# Patient Record
Sex: Male | Born: 1995
Health system: Southern US, Community
[De-identification: ages and names within clinical notes are randomized; demographics above are authoritative.]

## PROBLEM LIST (undated history)

## (undated) DIAGNOSIS — G40909 Epilepsy, unspecified, not intractable, without status epilepticus: Secondary | ICD-10-CM

---
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
## 2019-01-22 ENCOUNTER — Encounter: Payer: Self-pay | Admitting: Family Medicine

## 2019-01-22 ENCOUNTER — Ambulatory Visit (INDEPENDENT_AMBULATORY_CARE_PROVIDER_SITE_OTHER): Payer: BLUE CROSS/BLUE SHIELD | Admitting: Family Medicine

## 2019-01-22 VITALS — BP 184/142 | HR 99 | Ht 71.75 in | Wt 294.0 lb

## 2019-01-22 DIAGNOSIS — R030 Elevated blood-pressure reading, without diagnosis of hypertension: Secondary | ICD-10-CM | POA: Diagnosis not present

## 2019-01-22 DIAGNOSIS — I10 Essential (primary) hypertension: Secondary | ICD-10-CM

## 2019-01-22 MED ORDER — OLMESARTAN MEDOXOMIL-HCTZ 20-12.5 MG PO TABS: 1.0000 | ORAL_TABLET | Freq: Every day | ORAL | 0 refills | Status: DC

## 2019-01-22 NOTE — Patient Instructions (Signed)

## 2019-01-22 NOTE — Progress Notes (Signed)
Christian Wagner - 23 y.o. male MRN [id number]  Date of birth: [birth year]   This visit type was conducted due to national recommendations for restrictions regarding the COVID-19 Pandemic (e.g. social distancing).  This format is felt to be most appropriate for this patient at this time.  All issues noted in this document were discussed and addressed.  No physical exam was performed (except for noted visual exam findings with Video Visits).  I discussed the limitations of evaluation and management by telemedicine and the availability of in person appointments. The patient expressed understanding and agreed to proceed.  I connected with@ on 01/22/19 at  1:30 PM EDT by a video enabled telemedicine application and verified that I am speaking with the correct person using two identifiers.   Patient Location[place] [address] [place] [id number]   Provider location:   Home office  Chief Complaint  Patient presents with  . Hypertension    NP , BP reading 180/123 , FAx HTN - MGM    HPI  Christian Wagner is a 23 y.o. male who presents via audio/video conferencing for a telehealth visit today.  This is [name]'s initial visit with me and he has concern about elevated blood pressure.  Reports he has had several readings at home with BP around 180/100-120.  He denies any symptoms related to hypertension including headache, vision changes, chest pain, shortness of breath, or dizziness.  He does admit to eating a diet in highly processed and fast foods.  He has a fairly physical job.  Mother reports snoring but patient denies any feeling of fatigue or difficulty sleeping.    ROS:  A comprehensive ROS was completed and negative except as noted per HPI  Past Medical History:  Diagnosis Date  . Epilepsy (HCC)     History reviewed. No pertinent surgical history.  Family History  Problem Relation Age of Onset  . Hypertension Maternal Uncle   . Hypertension Maternal Grandmother      Social History   Socioeconomic History  . Marital status: Single    Spouse name: Not on file  . Number of children: Not on file  . Years of education: Not on file  . Highest education level: Not on file  Occupational History  . Not on file  Social Needs  . Financial resource strain: Not on file  . Food insecurity:    Worry: Not on file    Inability: Not on file  . Transportation needs:    Medical: Not on file    Non-medical: Not on file  Tobacco Use  . Smoking status: Former Games developermoker  . Smokeless tobacco: Never Used  Substance and Sexual Activity  . Alcohol use: Yes    Comment: soically   . Drug use: No  . Sexual activity: Not on file  Lifestyle  . Physical activity:    Days per week: Not on file    Minutes per session: Not on file  . Stress: Not on file  Relationships  . Social connections:    Talks on phone: Not on file    Gets together: Not on file    Attends religious service: Not on file    Active member of club or organization: Not on file    Attends meetings of clubs or organizations: Not on file    Relationship status: Not on file  . Intimate partner violence:    Fear of current or ex partner: Not on file    Emotionally abused: Not  on file    Physically abused: Not on file    Forced sexual activity: Not on file  Other Topics Concern  . Not on file  Social History Narrative  . Not on file    No current outpatient medications on file.  EXAM:  VITALS per patient if applicable: BP (!) 184/142   Pulse (!) 110   Ht 5' 11.75" (1.822 m)   Wt 294 lb (133.4 kg) Comment: pt reports from hm  BMI 40.15 kg/m   GENERAL: alert, oriented, appears well and in no acute distress  HEENT: atraumatic, conjunttiva clear, no obvious abnormalities on inspection of external nose and ears  NECK: normal movements of the head and neck  LUNGS: on inspection no signs of respiratory distress, breathing rate appears normal, no obvious gross SOB, gasping or wheezing  CV: no  obvious cyanosis  MS: moves all visible extremities without noticeable abnormality  PSYCH/NEURO: pleasant and cooperative, no obvious depression or anxiety, speech and thought processing grossly intact  ASSESSMENT AND PLAN:  Discussed the following assessment and plan:  Essential hypertension -BP elevated at home.  I had him stop by the clinic this afternoon as well for a manual BP reading which was 194/134 with repeat 184/142. -Labs completed today.  -Will likely need combo therapy to get to goal, will start benicar-hct -Limit high salt foods/fast foods -He will continue to monitor BP at home and follow up with me in 1 month.  -Discussed red flags that would prompt him to see emergency care regarding his BP.         I discussed the assessment and treatment plan with the patient. The patient was provided an opportunity to ask questions and all were answered. The patient agreed with the plan and demonstrated an understanding of the instructions.   The patient was advised to call back or seek an in-person evaluation if the symptoms worsen or if the condition fails to improve as anticipated.   Everrett Coombe, DO

## 2019-01-22 NOTE — Assessment & Plan Note (Addendum)
-  BP elevated at home.  I had him stop by the clinic this afternoon as well for a manual BP reading which was 194/134 with repeat 184/142. -Labs completed today.  -Will likely need combo therapy to get to goal, will start benicar-hct -Limit high salt foods/fast foods -He will continue to monitor BP at home and follow up with me in 1 month.  -Discussed red flags that would prompt him to see emergency care regarding his BP.

## 2019-01-25 ENCOUNTER — Other Ambulatory Visit: Payer: Self-pay | Admitting: Family Medicine

## 2019-01-25 DIAGNOSIS — R7309 Other abnormal glucose: Secondary | ICD-10-CM

## 2019-01-25 LAB — COMPREHENSIVE METABOLIC PANEL
AG Ratio: 1.7 (calc) (ref 1.0–2.5)
ALT: 71 U/L — ABNORMAL HIGH (ref 9–46)
AST: 38 U/L (ref 10–40)
Albumin: 4.6 g/dL (ref 3.6–5.1)
Alkaline phosphatase (APISO): 82 U/L (ref 36–130)
BUN: 18 mg/dL (ref 7–25)
CO2: 26 mmol/L (ref 20–32)
Calcium: 9.4 mg/dL (ref 8.6–10.3)
Chloride: 103 mmol/L (ref 98–110)
Creat: 1.08 mg/dL (ref 0.60–1.35)
Globulin: 2.7 g/dL (calc) (ref 1.9–3.7)
Glucose, Bld: 128 mg/dL — ABNORMAL HIGH (ref 65–99)
Potassium: 4.4 mmol/L (ref 3.5–5.3)
Sodium: 137 mmol/L (ref 135–146)
Total Bilirubin: 0.6 mg/dL (ref 0.2–1.2)
Total Protein: 7.3 g/dL (ref 6.1–8.1)

## 2019-01-25 LAB — TEST AUTHORIZATION

## 2019-01-25 LAB — CBC
HCT: 43.2 % (ref 38.5–50.0)
Hemoglobin: 14.9 g/dL (ref 13.2–17.1)
MCH: 30.6 pg (ref 27.0–33.0)
MCHC: 34.5 g/dL (ref 32.0–36.0)
MCV: 88.7 fL (ref 80.0–100.0)
MPV: 12.3 fL (ref 7.5–12.5)
Platelets: 201 10*3/uL (ref 140–400)
RBC: 4.87 10*6/uL (ref 4.20–5.80)
RDW: 12.9 % (ref 11.0–15.0)
WBC: 6.7 10*3/uL (ref 3.8–10.8)

## 2019-01-25 LAB — HEMOGLOBIN A1C W/OUT EAG: Hgb A1c MFr Bld: 5.5 % of total Hgb (ref <5.7)

## 2019-01-25 LAB — TSH: TSH: 1.41 mIU/L (ref 0.40–4.50)

## 2019-01-25 NOTE — Progress Notes (Signed)
Test added.   

## 2019-01-25 NOTE — Progress Notes (Signed)
-  Glucose elevated but A1c is normal, meaning he does not have diabetes. -Liver enzymes are a little elevated which could be coming from fatty liver, high blood pressure and/or EtOH use.   -Recommend low salt, low fat diet and limited EtOH consumption (2 or less beers/Shots/mixed drinks per day)

## 2019-01-25 NOTE — Progress Notes (Signed)
Please ask lab to add a1c for elevated glucose. Orders entered.  Thanks.

## 2019-02-26 ENCOUNTER — Telehealth: Payer: BLUE CROSS/BLUE SHIELD | Admitting: Family Medicine

## 2019-02-26 ENCOUNTER — Encounter: Payer: Self-pay | Admitting: Family Medicine

## 2019-02-26 ENCOUNTER — Telehealth (INDEPENDENT_AMBULATORY_CARE_PROVIDER_SITE_OTHER): Payer: BLUE CROSS/BLUE SHIELD | Admitting: Family Medicine

## 2019-02-26 DIAGNOSIS — I10 Essential (primary) hypertension: Secondary | ICD-10-CM | POA: Diagnosis not present

## 2019-02-26 MED ORDER — OLMESARTAN MEDOXOMIL-HCTZ 40-25 MG PO TABS: 1.0000 | ORAL_TABLET | Freq: Every day | ORAL | 1 refills | Status: DC

## 2019-02-26 NOTE — Progress Notes (Signed)
Christian Wagner - 23 y.o. male MRN [id number]  Date of birth: 1996/04/13   This visit type was conducted due to national recommendations for restrictions regarding the COVID-19 Pandemic (e.g. social distancing).  This format is felt to be most appropriate for this patient at this time.  All issues noted in this document were discussed and addressed.  No physical exam was performed (except for noted visual exam findings with Video Visits).  I discussed the limitations of evaluation and management by telemedicine and the availability of in person appointments. The patient expressed understanding and agreed to proceed.  I connected with@ on 02/26/19 at 10:00 AM EDT by a video enabled telemedicine application and verified that I am speaking with the correct person using two identifiers.   Patient Location: Home [address] [place] [address]   Provider location:   Home office  Chief Complaint  Patient presents with  . Follow-up    4 wk F/U HTN / Meds check 150's-160's/100's-115    HPI  Christian Wagner is a 23 y.o. male who presents via audio/video conferencing for a telehealth visit today.  He is following up today for HTN.  Started on benicar-HCT at previous visit.  BP readings at home are now around 150's/90-100's.  LFT's elevated on recent lab work.  He admits to 4-5 beers every day.  Denies underlying anxiety or sleep problems.  Thinks he can make changes to EtOH consumption and dietary changes as well.  Denies chest pain, shortness of breath, palpitations, headache or vision changes.    ROS:  A comprehensive ROS was completed and negative except as noted per HPI  Past Medical History:  Diagnosis Date  . Epilepsy (HCC)     No past surgical history on file.  Family History  Problem Relation Age of Onset  . Hypertension Maternal Uncle   . Hypertension Maternal Grandmother     Social History   Socioeconomic History  . Marital status: Single    Spouse name: Not  on file  . Number of children: Not on file  . Years of education: Not on file  . Highest education level: Not on file  Occupational History  . Not on file  Social Needs  . Financial resource strain: Not on file  . Food insecurity:    Worry: Not on file    Inability: Not on file  . Transportation needs:    Medical: Not on file    Non-medical: Not on file  Tobacco Use  . Smoking status: Former Games developer  . Smokeless tobacco: Never Used  Substance and Sexual Activity  . Alcohol use: Yes    Comment: soically   . Drug use: No  . Sexual activity: Not on file  Lifestyle  . Physical activity:    Days per week: Not on file    Minutes per session: Not on file  . Stress: Not on file  Relationships  . Social connections:    Talks on phone: Not on file    Gets together: Not on file    Attends religious service: Not on file    Active member of club or organization: Not on file    Attends meetings of clubs or organizations: Not on file    Relationship status: Not on file  . Intimate partner violence:    Fear of current or ex partner: Not on file    Emotionally abused: Not on file    Physically abused: Not on file    Forced  sexual activity: Not on file  Other Topics Concern  . Not on file  Social History Narrative  . Not on file     Current Outpatient Medications:  .  olmesartan-hydrochlorothiazide (BENICAR HCT) 40-25 MG tablet, Take 1 tablet by mouth daily., Disp: 90 tablet, Rfl: 1  EXAM:  VITALS per patient if applicable: BP (!) 159/106   Ht 5' 11.75" (1.822 m)   Wt 294 lb (133.4 kg)   BMI 40.15 kg/m   GENERAL: alert, oriented, appears well and in no acute distress  HEENT: atraumatic, conjunttiva clear, no obvious abnormalities on inspection of external nose and ears  NECK: normal movements of the head and neck  LUNGS: on inspection no signs of respiratory distress, breathing rate appears normal, no obvious gross SOB, gasping or wheezing  CV: no obvious cyanosis   MS: moves all visible extremities without noticeable abnormality  PSYCH/NEURO: pleasant and cooperative, no obvious depression or anxiety, speech and thought processing grossly intact  ASSESSMENT AND PLAN:  Discussed the following assessment and plan:  Essential hypertension -BP remains uncontrolled -Continue to titrate benicar-hct to 40/25mg .  -Discussed reduction in EtOH intake and following a low salt diet.  -F/u in 6 weeks       I discussed the assessment and treatment plan with the patient. The patient was provided an opportunity to ask questions and all were answered. The patient agreed with the plan and demonstrated an understanding of the instructions.   The patient was advised to call back or seek an in-person evaluation if the symptoms worsen or if the condition fails to improve as anticipated.    Everrett Coombeody Amariz Flamenco, DO

## 2019-02-26 NOTE — Assessment & Plan Note (Signed)
-  BP remains uncontrolled -Continue to titrate benicar-hct to 40/25mg .  -Discussed reduction in EtOH intake and following a low salt diet.  -F/u in 6 weeks

## 2019-04-09 ENCOUNTER — Telehealth: Payer: Self-pay

## 2019-04-09 NOTE — Telephone Encounter (Signed)
Questions for Screening COVID-19  Symptom onset: none, Pt was given & repeated #2046  Travel or Contacts:none  During this illness, did/does the patient experience any of the following symptoms? Fever >100.6F []   Yes [x]   No []   Unknown Subjective fever (felt feverish) []   Yes [x]   No []   Unknown Chills []   Yes [x]   No []   Unknown Muscle aches (myalgia) []   Yes [x]   No []   Unknown Runny nose (rhinorrhea) []   Yes [x]   No []   Unknown Sore throat []   Yes [x]   No []   Unknown Cough (new onset or worsening of chronic cough) []   Yes [x]   No []   Unknown Shortness of breath (dyspnea) []   Yes [x]   No []   Unknown Nausea or vomiting []   Yes []   No [x]   Unknown Headache []   Yes []   No [x]   Unknown Abdominal pain  []   Yes [x]   No []   Unknown Diarrhea (?3 loose/looser than normal stools/24hr period) []   Yes [x]   No []   Unknown Other, specify:  Patient risk factors: Smoker? []   Current [x]   Former []   Never If male, currently pregnant? []   Yes [x]   No  Patient Active Problem List   Diagnosis Date Noted  . Essential hypertension 01/22/2019    Plan:  []   High risk for COVID-19 with red flags go to ED (with CP, SOB, weak/lightheaded, or fever > 101.5). Call ahead.  []   High risk for COVID-19 but stable. Inform provider and coordinate time for [facility] visit.   [x]   No red flags but URI signs or symptoms okay for [facility] visit.

## 2019-04-12 ENCOUNTER — Ambulatory Visit (INDEPENDENT_AMBULATORY_CARE_PROVIDER_SITE_OTHER): Payer: BC Managed Care – PPO | Admitting: Family Medicine

## 2019-04-12 ENCOUNTER — Encounter: Payer: Self-pay | Admitting: Family Medicine

## 2019-04-12 VITALS — BP 130/88 | HR 93 | Temp 98.4°F | Resp 18 | Ht 71.75 in | Wt 296.0 lb

## 2019-04-12 DIAGNOSIS — I10 Essential (primary) hypertension: Secondary | ICD-10-CM | POA: Diagnosis not present

## 2019-04-12 DIAGNOSIS — F1010 Alcohol abuse, uncomplicated: Secondary | ICD-10-CM | POA: Diagnosis not present

## 2019-04-12 NOTE — Progress Notes (Signed)
[name] - 23 y.o. male MRN [id number]  Date of birth: 01-26-1996  Subjective Chief Complaint  Patient presents with  . Follow-up    6 wk F/U HTN     HPI Christian Wagner is a 23 y.o. male here today for follow up of HTN.  Current treatment with benicar-hct 40/25mg .  He is tolerating well.  BP at home ranging from 130-140/85-94.  He does admit today that he has been drinking a good amount of EtOH.  He had been consuming a 40 oz beer and a four loko every night.  He has cut out the four loko and is working on further reducing his alcohol consumption.  He also recently started exercising more.    He denies chest pain, shortness of breath, palpitations, headache or vision changes.  Denies significant anxiety at this time.   Depression screen [facility] 2/9 04/12/2019  Decreased Interest 0  Down, Depressed, Hopeless 0  PHQ - 2 Score 0  Altered sleeping 1  Tired, decreased energy 1  Change in appetite 0  Feeling bad or failure about yourself  1  Trouble concentrating 0  Moving slowly or fidgety/restless 0  Suicidal thoughts 0  PHQ-9 Score 3  Difficult doing work/chores Somewhat difficult   GAD 7 : Generalized Anxiety Score 04/12/2019  Nervous, Anxious, on Edge 2  Control/stop worrying 1  Worry too much - different things 1  Trouble relaxing 1  Restless 0  Easily annoyed or irritable 0  Afraid - awful might happen 1  Total GAD 7 Score 6  Anxiety Difficulty Somewhat difficult      ROS:  A comprehensive ROS was completed and negative except as noted per HPI  Allergies  Allergen Reactions  . Penicillins     Past Medical History:  Diagnosis Date  . Epilepsy ([place])     No past surgical history on file.  Social History   Socioeconomic History  . Marital status: Single    Spouse name: Not on file  . Number of children: Not on file  . Years of education: Not on file  . Highest education level: Not on file  Occupational History  . Not on file  Social Needs  .  Financial resource strain: Not on file  . Food insecurity    Worry: Not on file    Inability: Not on file  . Transportation needs    Medical: Not on file    Non-medical: Not on file  Tobacco Use  . Smoking status: Former Research scientist (life sciences)  . Smokeless tobacco: Never Used  Substance and Sexual Activity  . Alcohol use: Yes    Comment: soically   . Drug use: No  . Sexual activity: Not on file  Lifestyle  . Physical activity    Days per week: Not on file    Minutes per session: Not on file  . Stress: Not on file  Relationships  . Social Herbalist on phone: Not on file    Gets together: Not on file    Attends religious service: Not on file    Active member of club or organization: Not on file    Attends meetings of clubs or organizations: Not on file    Relationship status: Not on file  Other Topics Concern  . Not on file  Social History Narrative  . Not on file    Family History  Problem Relation Age of Onset  . Hypertension Maternal Uncle   . Hypertension Maternal  Grandmother     Health Maintenance  Topic Date Due  . HIV Screening  10/20/2008  . INFLUENZA VACCINE  04/24/2019  . TETANUS/TDAP  03/20/2022    ----------------------------------------------------------------------------------------------------------------------------------------------------------------------------------------------------------------- Physical Exam BP 130/88   Pulse 93   Temp 98.4 F (36.9 C) (Oral)   Resp 18   Ht 5' 11.75" (1.822 m)   Wt 296 lb (134.3 kg)   SpO2 97%   BMI 40.43 kg/m   Physical Exam Constitutional:      Appearance: He is obese.  HENT:     Head: Normocephalic and atraumatic.     Mouth/Throat:     Mouth: Mucous membranes are moist.  Eyes:     General: No scleral icterus. Cardiovascular:     Rate and Rhythm: Normal rate and regular rhythm.  Pulmonary:     Effort: Pulmonary effort is normal.     Breath sounds: Normal breath sounds.  Skin:    General: Skin  is warm and dry.  Neurological:     General: No focal deficit present.     Mental Status: He is alert.  Psychiatric:        Mood and Affect: Mood normal.        Behavior: Behavior normal.     ------------------------------------------------------------------------------------------------------------------------------------------------------------------------------------------------------------------- Assessment and Plan  Essential hypertension -BP elevated initially, rechecked towards end of his appt with improvement to 130/88.  Recommend continuation of current medication.  -Continue to reduce EtOH consumption and follow low salt diet.  -Recommend weight loss with initial goal of 20-25lbs over the next 6 months.    >25 minutes spent with patient with >50% of time spent counseling regarding HTN and alcohol use.

## 2019-04-12 NOTE — Assessment & Plan Note (Signed)
-  BP elevated initially, rechecked towards end of his appt with improvement to 130/88.  Recommend continuation of current medication.  -Continue to reduce EtOH consumption and follow low salt diet.  -Recommend weight loss with initial goal of 20-25lbs over the next 6 months.

## 2019-04-12 NOTE — Patient Instructions (Signed)
Continue to work on cutting back on alcohol use, with goal of quitting Follow a low salt diet with regular exercise.  Your goal for the next time I see you is a 20-25lb weight loss.    Hypertension, Adult Hypertension is another name for high blood pressure. High blood pressure forces your heart to work harder to pump blood. This can cause problems over time. There are two numbers in a blood pressure reading. There is a top number (systolic) over a bottom number (diastolic). It is best to have a blood pressure that is below 120/80. Healthy choices can help lower your blood pressure, or you may need medicine to help lower it. What are the causes? The cause of this condition is not known. Some conditions may be related to high blood pressure. What increases the risk?  Smoking.  Having type 2 diabetes mellitus, high cholesterol, or both.  Not getting enough exercise or physical activity.  Being overweight.  Having too much fat, sugar, calories, or salt (sodium) in your diet.  Drinking too much alcohol.  Having long-term (chronic) kidney disease.  Having a family history of high blood pressure.  Age. Risk increases with age.  Race. You may be at higher risk if you are African American.  Gender. Men are at higher risk than women before age 56. After age 10, women are at higher risk than men.  Having obstructive sleep apnea.  Stress. What are the signs or symptoms?  High blood pressure may not cause symptoms. Very high blood pressure (hypertensive crisis) may cause: ? Headache. ? Feelings of worry or nervousness (anxiety). ? Shortness of breath. ? Nosebleed. ? A feeling of being sick to your stomach (nausea). ? Throwing up (vomiting). ? Changes in how you see. ? Very bad chest pain. ? Seizures. How is this treated?  This condition is treated by making healthy lifestyle changes, such as: ? Eating healthy foods. ? Exercising more. ? Drinking less alcohol.  Your health  care provider may prescribe medicine if lifestyle changes are not enough to get your blood pressure under control, and if: ? Your top number is above 130. ? Your bottom number is above 80.  Your personal target blood pressure may vary. Follow these instructions at home: Eating and drinking   If told, follow the DASH eating plan. To follow this plan: ? Fill one half of your plate at each meal with fruits and vegetables. ? Fill one fourth of your plate at each meal with whole grains. Whole grains include whole-wheat pasta, brown rice, and whole-grain bread. ? Eat or drink low-fat dairy products, such as skim milk or low-fat yogurt. ? Fill one fourth of your plate at each meal with low-fat (lean) proteins. Low-fat proteins include fish, chicken without skin, eggs, beans, and tofu. ? Avoid fatty meat, cured and processed meat, or chicken with skin. ? Avoid pre-made or processed food.  Eat less than 1,500 mg of salt each day.  Do not drink alcohol if: ? Your doctor tells you not to drink. ? You are pregnant, may be pregnant, or are planning to become pregnant.  If you drink alcohol: ? Limit how much you use to:  0-1 drink a day for women.  0-2 drinks a day for men. ? Be aware of how much alcohol is in your drink. In the [place] one drink equals one 12 oz bottle of beer (355 mL), one 5 oz glass of wine (148 mL), or one 1 oz glass of hard  liquor (44 mL). Lifestyle   Work with your doctor to stay at a healthy weight or to lose weight. Ask your doctor what the best weight is for you.  Get at least 30 minutes of exercise most days of the week. This may include walking, swimming, or biking.  Get at least 30 minutes of exercise that strengthens your muscles (resistance exercise) at least 3 days a week. This may include lifting weights or doing Pilates.  Do not use any products that contain nicotine or tobacco, such as cigarettes, e-cigarettes, and chewing tobacco. If you need help quitting,  ask your doctor.  Check your blood pressure at home as told by your doctor.  Keep all follow-up visits as told by your doctor. This is important. Medicines  Take over-the-counter and prescription medicines only as told by your doctor. Follow directions carefully.  Do not skip doses of blood pressure medicine. The medicine does not work as well if you skip doses. Skipping doses also puts you at risk for problems.  Ask your doctor about side effects or reactions to medicines that you should watch for. Contact a doctor if you:  Think you are having a reaction to the medicine you are taking.  Have headaches that keep coming back (recurring).  Feel dizzy.  Have swelling in your ankles.  Have trouble with your vision. Get help right away if you:  Get a very bad headache.  Start to feel mixed up (confused).  Feel weak or numb.  Feel faint.  Have very bad pain in your: ? Chest. ? Belly (abdomen).  Throw up more than once.  Have trouble breathing. Summary  Hypertension is another name for high blood pressure.  High blood pressure forces your heart to work harder to pump blood.  For most people, a normal blood pressure is less than 120/80.  Making healthy choices can help lower blood pressure. If your blood pressure does not get lower with healthy choices, you may need to take medicine. This information is not intended to replace advice given to you by your health care provider. Make sure you discuss any questions you have with your health care provider. Document Released: 02/26/2008 Document Revised: 05/20/2018 Document Reviewed: 05/20/2018 Elsevier Patient Education  2020 [employer]

## 2019-04-14 ENCOUNTER — Other Ambulatory Visit: Payer: Self-pay | Admitting: Family Medicine

## 2019-04-14 NOTE — Telephone Encounter (Signed)
PT was seen this week. He's on Olmesartan-HCTZ 20-12.5 mg, but Pt states that his pharmacy gave him Lisinopril . LOV note stated to titrate up to 40-25mg . Please advise. Thanks

## 2019-04-19 NOTE — Telephone Encounter (Signed)
Rx changed to benicar-hctz 40/25mg  at last appt.

## 2019-04-19 NOTE — Telephone Encounter (Signed)
Called [facility] # [id number], Spoke to Rule, She stated "PT picked up Benicar-HCTZ 40mg -25mg  on 04/21/2019". Task completed.

## 2019-09-12 ENCOUNTER — Other Ambulatory Visit: Payer: Self-pay | Admitting: Family Medicine

## 2019-09-13 NOTE — Telephone Encounter (Signed)
Please refill x30 days and schedule f/u appt.

## 2019-09-16 NOTE — Telephone Encounter (Signed)
Would it be okay for a virtual visit or office only?

## 2019-10-13 ENCOUNTER — Encounter: Payer: Self-pay | Admitting: Family Medicine

## 2019-10-13 ENCOUNTER — Ambulatory Visit (INDEPENDENT_AMBULATORY_CARE_PROVIDER_SITE_OTHER): Payer: BC Managed Care – PPO | Admitting: Family Medicine

## 2019-10-13 ENCOUNTER — Other Ambulatory Visit: Payer: Self-pay

## 2019-10-13 VITALS — BP 132/84 | HR 80 | Temp 98.2°F | Ht 71.75 in | Wt 295.4 lb

## 2019-10-13 DIAGNOSIS — I10 Essential (primary) hypertension: Secondary | ICD-10-CM

## 2019-10-13 DIAGNOSIS — F411 Generalized anxiety disorder: Secondary | ICD-10-CM | POA: Diagnosis not present

## 2019-10-13 DIAGNOSIS — Z23 Encounter for immunization: Secondary | ICD-10-CM

## 2019-10-13 DIAGNOSIS — F1010 Alcohol abuse, uncomplicated: Secondary | ICD-10-CM

## 2019-10-13 LAB — BASIC METABOLIC PANEL
BUN: 24 mg/dL — ABNORMAL HIGH (ref 6–23)
CO2: 26 mEq/L (ref 19–32)
Calcium: 9.5 mg/dL (ref 8.4–10.5)
Chloride: 100 mEq/L (ref 96–112)
Creatinine, Ser: 0.92 mg/dL (ref 0.40–1.50)
GFR: 99.46 mL/min (ref >60.00)
Glucose, Bld: 99 mg/dL (ref 70–99)
Potassium: 3.8 mEq/L (ref 3.5–5.1)
Sodium: 135 mEq/L (ref 135–145)

## 2019-10-13 NOTE — Progress Notes (Signed)
Christian Wagner - 24 y.o. male MRN [id number]  Date of birth: [birth year]  Subjective Chief Complaint  Patient presents with  . Hypertension    6 month follow up.  Pt has questions about the Flu shot.    HPI Christian Wagner is a 24 y.o. male with history of HTN and EtOH dependence here today for follow up of HTN.  He is currently treated with benicar-hct.  He reports that he is doing well with current medication.  He denies side effects.  He does not check BP at home. He does not follow a low salt diet or exercise regularly.  He denies chest pain, shortness of breath, palpitations, edema or headache.    He reports that he had increased his EtOH intake over the holidays due to stress.  He has cut back again now.  He declines medication or referral for counseling to help with his anxiety.    ROS:  A comprehensive ROS was completed and negative except as noted per HPI  Allergies  Allergen Reactions  . Penicillins     Past Medical History:  Diagnosis Date  . Epilepsy ([place])     History reviewed. No pertinent surgical history.  Social History   Socioeconomic History  . Marital status: Single    Spouse name: Not on file  . Number of children: Not on file  . Years of education: Not on file  . Highest education level: Not on file  Occupational History  . Not on file  Tobacco Use  . Smoking status: Former Research scientist (life sciences)  . Smokeless tobacco: Never Used  Substance and Sexual Activity  . Alcohol use: Yes    Comment: soically   . Drug use: No  . Sexual activity: Not on file  Other Topics Concern  . Not on file  Social History Narrative  . Not on file   Social Determinants of Health   Financial Resource Strain:   . Difficulty of Paying Living Expenses: Not on file  Food Insecurity:   . Worried About Charity fundraiser in the Last Year: Not on file  . Ran Out of Food in the Last Year: Not on file  Transportation Needs:   . Lack of Transportation (Medical): Not on file  .  Lack of Transportation (Non-Medical): Not on file  Physical Activity:   . Days of Exercise per Week: Not on file  . Minutes of Exercise per Session: Not on file  Stress:   . Feeling of Stress : Not on file  Social Connections:   . Frequency of Communication with Friends and Family: Not on file  . Frequency of Social Gatherings with Friends and Family: Not on file  . Attends Religious Services: Not on file  . Active Member of Clubs or Organizations: Not on file  . Attends Archivist Meetings: Not on file  . Marital Status: Not on file    Family History  Problem Relation Age of Onset  . Hypertension Maternal Uncle   . Hypertension Maternal Grandmother     Health Maintenance  Topic Date Due  . HIV Screening  10/20/2008  . INFLUENZA VACCINE  04/24/2019  . TETANUS/TDAP  03/20/2022    ----------------------------------------------------------------------------------------------------------------------------------------------------------------------------------------------------------------- Physical Exam BP 132/84   Pulse 80   Temp 98.2 F (36.8 C) (Temporal)   Ht 5' 11.75" (1.822 m)   Wt 295 lb 6.4 oz (134 kg)   SpO2 99%   BMI 40.34 kg/m   Physical Exam Constitutional:  Appearance: Normal appearance.  HENT:     Head: Normocephalic and atraumatic.  Eyes:     General: No scleral icterus. Cardiovascular:     Rate and Rhythm: Normal rate and regular rhythm.  Pulmonary:     Effort: Pulmonary effort is normal.     Breath sounds: Normal breath sounds.  Skin:    General: Skin is warm and dry.  Neurological:     General: No focal deficit present.     Mental Status: He is alert.  Psychiatric:        Mood and Affect: Mood normal.        Behavior: Behavior normal.      ------------------------------------------------------------------------------------------------------------------------------------------------------------------------------------------------------------------- Assessment and Plan  Alcohol use disorder, mild, abuse Recommended that he continue to cut back with goal of d/c EtOH consumption due to his history of overuse/abuse.   GAD (generalized anxiety disorder) Discussed recommendations for medication and/or counseling to help with anxiety and EtOH abuse.  He declines.   Essential hypertension BP is well controlled at this time. Discussed how EtOH use affects BP.  Recommend low salt diet.   Continue current medications. Update BMP.    This visit occurred during the SARS-CoV-2 public health emergency.  Safety protocols were in place, including screening questions prior to the visit, additional usage of staff PPE, and extensive cleaning of exam room while observing appropriate contact time as indicated for disinfecting solutions.

## 2019-10-13 NOTE — Patient Instructions (Addendum)
Health Maintenance Due  Topic Date Due  . HIV Screening  10/20/2008  . INFLUENZA VACCINE  04/24/2019    Hypertension, Adult High blood pressure (hypertension) is when the force of blood pumping through the arteries is too strong. The arteries are the blood vessels that carry blood from the heart throughout the body. Hypertension forces the heart to work harder to pump blood and may cause arteries to become narrow or stiff. Untreated or uncontrolled hypertension can cause a heart attack, heart failure, a stroke, kidney disease, and other problems. A blood pressure reading consists of a higher number over a lower number. Ideally, your blood pressure should be below 120/80. The first ("top") number is called the systolic pressure. It is a measure of the pressure in your arteries as your heart beats. The second ("bottom") number is called the diastolic pressure. It is a measure of the pressure in your arteries as the heart relaxes. What are the causes? The exact cause of this condition is not known. There are some conditions that result in or are related to high blood pressure. What increases the risk? Some risk factors for high blood pressure are under your control. The following factors may make you more likely to develop this condition:  Smoking.  Having type 2 diabetes mellitus, high cholesterol, or both.  Not getting enough exercise or physical activity.  Being overweight.  Having too much fat, sugar, calories, or salt (sodium) in your diet.  Drinking too much alcohol. Some risk factors for high blood pressure may be difficult or impossible to change. Some of these factors include:  Having chronic kidney disease.  Having a family history of high blood pressure.  Age. Risk increases with age.  Race. You may be at higher risk if you are African American.  Gender. Men are at higher risk than women before age 23. After age 22, women are at higher risk than men.  Having obstructive  sleep apnea.  Stress. What are the signs or symptoms? High blood pressure may not cause symptoms. Very high blood pressure (hypertensive crisis) may cause:  Headache.  Anxiety.  Shortness of breath.  Nosebleed.  Nausea and vomiting.  Vision changes.  Severe chest pain.  Seizures. How is this diagnosed? This condition is diagnosed by measuring your blood pressure while you are seated, with your arm resting on a flat surface, your legs uncrossed, and your feet flat on the floor. The cuff of the blood pressure monitor will be placed directly against the skin of your upper arm at the level of your heart. It should be measured at least twice using the same arm. Certain conditions can cause a difference in blood pressure between your right and left arms. Certain factors can cause blood pressure readings to be lower or higher than normal for a short period of time:  When your blood pressure is higher when you are in a health care provider's office than when you are at home, this is called white coat hypertension. Most people with this condition do not need medicines.  When your blood pressure is higher at home than when you are in a health care provider's office, this is called masked hypertension. Most people with this condition may need medicines to control blood pressure. If you have a high blood pressure reading during one visit or you have normal blood pressure with other risk factors, you may be asked to:  Return on a different day to have your blood pressure checked again.  Monitor  your blood pressure at home for 1 week or longer. If you are diagnosed with hypertension, you may have other blood or imaging tests to help your health care provider understand your overall risk for other conditions. How is this treated? This condition is treated by making healthy lifestyle changes, such as eating healthy foods, exercising more, and reducing your alcohol intake. Your health care provider  may prescribe medicine if lifestyle changes are not enough to get your blood pressure under control, and if:  Your systolic blood pressure is above 130.  Your diastolic blood pressure is above 80. Your personal target blood pressure may vary depending on your medical conditions, your age, and other factors. Follow these instructions at home: Eating and drinking   Eat a diet that is high in fiber and potassium, and low in sodium, added sugar, and fat. An example eating plan is called the DASH (Dietary Approaches to Stop Hypertension) diet. To eat this way: ? Eat plenty of fresh fruits and vegetables. Try to fill one half of your plate at each meal with fruits and vegetables. ? Eat whole grains, such as whole-wheat pasta, brown rice, or whole-grain bread. Fill about one fourth of your plate with whole grains. ? Eat or drink low-fat dairy products, such as skim milk or low-fat yogurt. ? Avoid fatty cuts of meat, processed or cured meats, and poultry with skin. Fill about one fourth of your plate with lean proteins, such as fish, chicken without skin, beans, eggs, or tofu. ? Avoid pre-made and processed foods. These tend to be higher in sodium, added sugar, and fat.  Reduce your daily sodium intake. Most people with hypertension should eat less than 1,500 mg of sodium a day.  Do not drink alcohol if: ? Your health care provider tells you not to drink. ? You are pregnant, may be pregnant, or are planning to become pregnant.  If you drink alcohol: ? Limit how much you use to:  0-1 drink a day for women.  0-2 drinks a day for men. ? Be aware of how much alcohol is in your drink. In the [place] one drink equals one 12 oz bottle of beer (355 mL), one 5 oz glass of wine (148 mL), or one 1 oz glass of hard liquor (44 mL). Lifestyle   Work with your health care provider to maintain a healthy body weight or to lose weight. Ask what an ideal weight is for you.  Get at least 30 minutes of exercise  most days of the week. Activities may include walking, swimming, or biking.  Include exercise to strengthen your muscles (resistance exercise), such as Pilates or lifting weights, as part of your weekly exercise routine. Try to do these types of exercises for 30 minutes at least 3 days a week.  Do not use any products that contain nicotine or tobacco, such as cigarettes, e-cigarettes, and chewing tobacco. If you need help quitting, ask your health care provider.  Monitor your blood pressure at home as told by your health care provider.  Keep all follow-up visits as told by your health care provider. This is important. Medicines  Take over-the-counter and prescription medicines only as told by your health care provider. Follow directions carefully. Blood pressure medicines must be taken as prescribed.  Do not skip doses of blood pressure medicine. Doing this puts you at risk for problems and can make the medicine less effective.  Ask your health care provider about side effects or reactions to medicines that  you should watch for. Contact a health care provider if you:  Think you are having a reaction to a medicine you are taking.  Have headaches that keep coming back (recurring).  Feel dizzy.  Have swelling in your ankles.  Have trouble with your vision. Get help right away if you:  Develop a severe headache or confusion.  Have unusual weakness or numbness.  Feel faint.  Have severe pain in your chest or abdomen.  Vomit repeatedly.  Have trouble breathing. Summary  Hypertension is when the force of blood pumping through your arteries is too strong. If this condition is not controlled, it may put you at risk for serious complications.  Your personal target blood pressure may vary depending on your medical conditions, your age, and other factors. For most people, a normal blood pressure is less than 120/80.  Hypertension is treated with lifestyle changes, medicines, or a  combination of both. Lifestyle changes include losing weight, eating a healthy, low-sodium diet, exercising more, and limiting alcohol. This information is not intended to replace advice given to you by your health care provider. Make sure you discuss any questions you have with your health care provider. Document Revised: 05/20/2018 Document Reviewed: 05/20/2018 Elsevier Patient Education  2020 [employer].
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
## 2019-10-13 NOTE — Assessment & Plan Note (Signed)
BP is well controlled at this time. Discussed how EtOH use affects BP.  Recommend low salt diet.   Continue current medications. Update BMP.

## 2019-10-13 NOTE — Assessment & Plan Note (Signed)
Discussed recommendations for medication and/or counseling to help with anxiety and EtOH abuse.  He declines.

## 2020-03-11 ENCOUNTER — Other Ambulatory Visit: Payer: Self-pay | Admitting: Family Medicine

## 2020-03-17 ENCOUNTER — Other Ambulatory Visit: Payer: Self-pay | Admitting: Family Medicine

## 2020-03-17 NOTE — Telephone Encounter (Signed)
Called patient concerning med refill.   Pt has not been established with Dr. Ashley Royalty at [facility] location.   Refills have been denied because of this.   LVM for patient callback.   Patient can reestablish with Dr. Ashley Royalty or new PCP for med refills.

## 2020-06-09 ENCOUNTER — Other Ambulatory Visit: Payer: Self-pay | Admitting: Family Medicine

## 2020-06-15 ENCOUNTER — Telehealth: Payer: Self-pay | Admitting: Nurse Practitioner

## 2020-06-15 DIAGNOSIS — I10 Essential (primary) hypertension: Secondary | ICD-10-CM

## 2020-06-15 MED ORDER — OLMESARTAN MEDOXOMIL-HCTZ 40-25 MG PO TABS: 1.0000 | ORAL_TABLET | Freq: Every day | ORAL | 0 refills | Status: DC

## 2020-06-15 NOTE — Telephone Encounter (Signed)
30tabs sents

## 2020-06-15 NOTE — Telephone Encounter (Signed)
Pt has a TOC set for 07/17/20 and he has 7 pills left of olmesartan-hydrochlorothiazide (BENICAR HCT) 40-25 MG tablet Can this be filled to cover him until his appt date. If so he would like it sent to [facility] in [place], [address]

## 2020-06-15 NOTE — Telephone Encounter (Signed)
Left message to call back but also sent patient a My Chart message letting him know that 30 tabs was sent into his pharmacy for pick up.

## 2020-07-12 ENCOUNTER — Other Ambulatory Visit: Payer: Self-pay | Admitting: Nurse Practitioner

## 2020-07-12 DIAGNOSIS — I10 Essential (primary) hypertension: Secondary | ICD-10-CM

## 2020-07-17 ENCOUNTER — Encounter: Payer: Self-pay | Admitting: Nurse Practitioner

## 2020-07-17 ENCOUNTER — Encounter (INDEPENDENT_AMBULATORY_CARE_PROVIDER_SITE_OTHER): Payer: Self-pay

## 2020-07-17 ENCOUNTER — Other Ambulatory Visit: Payer: Self-pay

## 2020-07-17 ENCOUNTER — Ambulatory Visit (INDEPENDENT_AMBULATORY_CARE_PROVIDER_SITE_OTHER): Payer: BC Managed Care – PPO | Admitting: Nurse Practitioner

## 2020-07-17 VITALS — BP 140/90 | HR 104 | Temp 97.8°F | Ht 70.0 in | Wt 316.2 lb

## 2020-07-17 DIAGNOSIS — F1010 Alcohol abuse, uncomplicated: Secondary | ICD-10-CM

## 2020-07-17 DIAGNOSIS — R7309 Other abnormal glucose: Secondary | ICD-10-CM

## 2020-07-17 DIAGNOSIS — I10 Essential (primary) hypertension: Secondary | ICD-10-CM

## 2020-07-17 LAB — LIPID PANEL
Cholesterol: 194 mg/dL (ref 0–200)
HDL: 39.8 mg/dL (ref >39.00)
LDL Cholesterol: 120 mg/dL — ABNORMAL HIGH (ref 0–99)
NonHDL: 153.7
Total CHOL/HDL Ratio: 5
Triglycerides: 168 mg/dL — ABNORMAL HIGH (ref 0.0–149.0)
VLDL: 33.6 mg/dL (ref 0.0–40.0)

## 2020-07-17 LAB — HEPATIC FUNCTION PANEL
ALT: 68 U/L — ABNORMAL HIGH (ref 0–53)
AST: 35 U/L (ref 0–37)
Albumin: 4.7 g/dL (ref 3.5–5.2)
Alkaline Phosphatase: 93 U/L (ref 39–117)
Bilirubin, Direct: 0.1 mg/dL (ref 0.0–0.3)
Total Bilirubin: 0.6 mg/dL (ref 0.2–1.2)
Total Protein: 7.2 g/dL (ref 6.0–8.3)

## 2020-07-17 LAB — BASIC METABOLIC PANEL
BUN: 18 mg/dL (ref 6–23)
CO2: 26 mEq/L (ref 19–32)
Calcium: 9.6 mg/dL (ref 8.4–10.5)
Chloride: 100 mEq/L (ref 96–112)
Creatinine, Ser: 0.95 mg/dL (ref 0.40–1.50)
GFR: 110.29 mL/min (ref >60.00)
Glucose, Bld: 103 mg/dL — ABNORMAL HIGH (ref 70–99)
Potassium: 3.8 mEq/L (ref 3.5–5.1)
Sodium: 136 mEq/L (ref 135–145)

## 2020-07-17 LAB — HEMOGLOBIN A1C: Hgb A1c MFr Bld: 6 % (ref 4.6–6.5)

## 2020-07-17 NOTE — Assessment & Plan Note (Signed)
Denies any withdrawal symptoms without ETOH intake Drinks 7-8cans of beer per day. Advised to cut down 1-2drinks a day or occassional intake.

## 2020-07-17 NOTE — Assessment & Plan Note (Signed)
Mildly elevated today. No LE edema, no headache, no daytime fatigue/somnolence/apneic episodes. Advised about need to minimal ETOH intake and DASH diet. BP Readings from Last 3 Encounters:  07/17/20 140/90  10/13/19 132/84  04/12/19 130/88   Repeat BMP Maintain olmesartan/HCTZ F/up in 72months

## 2020-07-17 NOTE — Patient Instructions (Addendum)
Go to lab for blood draw You will be contacted to schedule an appt with weight management clinic  limit alcohol intake to no more than 1 drink a day for nonpregnant women and 2 drinks a day for men. One drink equals 12 oz of beer, 5 oz of wine, or 1 oz of hard liquor.  Alcohol Use Disorder Alcohol use disorder is when your drinking disrupts your daily life. When you have this condition, you drink too much alcohol and you cannot control your drinking. Alcohol use disorder can cause serious problems with your physical health. It can affect your brain, heart, liver, pancreas, immune system, stomach, and intestines. Alcohol use disorder can increase your risk for certain cancers and cause problems with your mental health, such as depression, anxiety, psychosis, delirium, and dementia. People with this disorder risk hurting themselves and others. What are the causes? This condition is caused by drinking too much alcohol over time. It is not caused by drinking too much alcohol only one or two times. Some people with this condition drink alcohol to cope with or escape from negative life events. Others drink to relieve pain or symptoms of mental illness. What increases the risk? You are more likely to develop this condition if:  You have a family history of alcohol use disorder.  Your culture encourages drinking to the point of intoxication, or makes alcohol easy to get.  You had a mood or conduct disorder in childhood.  You have been a victim of abuse.  You are an adolescent and: ? You have poor grades or difficulties in school. ? Your caregivers do not talk to you about saying no to alcohol, or supervise your activities. ? You are impulsive or you have trouble with self-control. What are the signs or symptoms? Symptoms of this condition include:  Drinkingmore than you want to.  Drinking for longer than you want to.  Trying several times to drink less or to control your  drinking.  Spending a lot of time getting alcohol, drinking, or recovering from drinking.  Craving alcohol.  Having problems at work, at school, or at home due to drinking.  Having problems in relationships due to drinking.  Drinking when it is dangerous to drink, such as before driving a car.  Continuing to drink even though you know you might have a physical or mental problem related to drinking.  Needing more and more alcohol to get the same effect you want from the alcohol (building up tolerance).  Having symptoms of withdrawal when you stop drinking. Symptoms of withdrawal include: ? Fatigue. ? Nightmares. ? Trouble sleeping. ? Depression. ? Anxiety. ? Fever. ? Seizures. ? Severe confusion. ? Feeling or seeing things that are not there (hallucinations). ? Tremors. ? Rapid heart rate. ? Rapid breathing. ? High blood pressure.  Drinking to avoid symptoms of withdrawal. How is this diagnosed? This condition is diagnosed with an assessment. Your health care provider may start the assessment by asking three or four questions about your drinking. Your health care provider may perform a physical exam or do lab tests to see if you have physical problems resulting from alcohol use. She or he may refer you to a mental health professional for evaluation. How is this treated? Some people with alcohol use disorder are able to reduce their alcohol use to low-risk levels. Others need to completely quit drinking alcohol. When necessary, mental health professionals with specialized training in substance use treatment can help. Your health care provider can help  you decide how severe your alcohol use disorder is and what type of treatment you need. The following forms of treatment are available:  Detoxification. Detoxification involves quitting drinking and using prescription medicines within the first week to help lessen withdrawal symptoms. This treatment is important for people who have  had withdrawal symptoms before and for heavy drinkers who are likely to have withdrawal symptoms. Alcohol withdrawal can be dangerous, and in severe cases, it can cause death. Detoxification may be provided in a home, community, or primary care setting, or in a hospital or substance use treatment facility.  Counseling. This treatment is also called talk therapy. It is provided by substance use treatment counselors. A counselor can address the reasons you use alcohol and suggest ways to keep you from drinking again or to prevent problem drinking. The goals of talk therapy are to: ? Find healthy activities and ways for you to cope with stress. ? Identify and avoid the things that trigger your alcohol use. ? Help you learn how to handle cravings.  Medicines.Medicines can help treat alcohol use disorder by: ? Decreasing alcohol cravings. ? Decreasing the positive feeling you have when you drink alcohol. ? Causing an uncomfortable physical reaction when you drink alcohol (aversion therapy).  Support groups. Support groups are led by people who have quit drinking. They provide emotional support, advice, and guidance. These forms of treatment are often combined. Some people with this condition benefit from a combination of treatments provided by specialized substance use treatment centers. Follow these instructions at home:  Take over-the-counter and prescription medicines only as told by your health care provider.  Check with your health care provider before starting any new medicines.  Ask friends and family members not to offer you alcohol.  Avoid situations where alcohol is served, including gatherings where others are drinking alcohol.  Create a plan for what to do when you are tempted to use alcohol.  Find hobbies or activities that you enjoy that do not include alcohol.  Keep all follow-up visits as told by your health care provider. This is important. How is this prevented?  If you  drink, limit alcohol intake to no more than 1 drink a day for nonpregnant women and 2 drinks a day for men. One drink equals 12 oz of beer, 5 oz of wine, or 1 oz of hard liquor.  If you have a mental health condition, get treatment and support.  Do not give alcohol to adolescents.  If you are an adolescent: ? Do not drink alcohol. ? Do not be afraid to say no if someone offers you alcohol. Speak up about why you do not want to drink. You can be a positive role model for your friends and set a good example for those around you by not drinking alcohol. ? If your friends drink, spend time with others who do not drink alcohol. Make new friends who do not use alcohol. ? Find healthy ways to manage stress and emotions, such as meditation or deep breathing, exercise, spending time in nature, listening to music, or talking with a trusted friend or family member. Contact a health care provider if:  You are not able to take your medicines as told.  Your symptoms get worse.  You return to drinking alcohol (relapse) and your symptoms get worse. Get help right away if:  You have thoughts about hurting yourself or others. If you ever feel like you may hurt yourself or others, or have thoughts about taking your  own life, get help right away. You can go to your nearest emergency department or call:  Your local emergency services (911 in the [place]  A suicide crisis helpline, such as the National Suicide Prevention Lifeline at (936)632-0153. This is open 24 hours a day. Summary  Alcohol use disorder is when your drinking disrupts your daily life. When you have this condition, you drink too much alcohol and you cannot control your drinking.  Treatment may include detoxification, counseling, medicine, and support groups.  Ask friends and family members not to offer you alcohol. Avoid situations where alcohol is served.  Get help right away if you have thoughts about hurting yourself or others. This  information is not intended to replace advice given to you by your health care provider. Make sure you discuss any questions you have with your health care provider. Document Revised: 08/22/2017 Document Reviewed: 06/06/2016 Elsevier Patient Education  2020 [employer].

## 2020-07-17 NOTE — Progress Notes (Signed)
Subjective:  Patient ID: Christian Wagner, male    DOB: [birth year]  Age: 24 y.o. MRN: [id number]  CC: Establish Care (TOC-[facility]/ Medication refill needed for BP medication)  HPI  Alcohol use disorder, mild, abuse Denies any withdrawal symptoms without ETOH intake Drinks 7-8cans of beer per day. Advised to cut down 1-2drinks a day or occassional intake.  Essential hypertension Mildly elevated today. No LE edema, no headache, no daytime fatigue/somnolence/apneic episodes. Advised about need to minimal ETOH intake and DASH diet. BP Readings from Last 3 Encounters:  07/17/20 140/90  10/13/19 132/84  04/12/19 130/88   Repeat BMP Maintain olmesartan/HCTZ F/up in 40months   Reviewed past Medical, Social and Family history today.  Outpatient Medications Prior to Visit  Medication Sig Dispense Refill  . olmesartan-hydrochlorothiazide (BENICAR HCT) 40-25 MG tablet Take 1 tablet by mouth daily. Maintain upcoming appt for additional refills 30 tablet 0   No facility-administered medications prior to visit.    ROS See HPI  Objective:  BP 140/90 (BP Location: Left Arm, Patient Position: Sitting, Cuff Size: Large)   Pulse (!) 104   Temp 97.8 F (36.6 C) (Temporal)   Ht 5\' 10"  (1.778 m)   Wt (!) 316 lb 3.2 oz (143.4 kg)   SpO2 98%   BMI 45.37 kg/m   Physical Exam Vitals reviewed.  Constitutional:      Appearance: He is obese.  Cardiovascular:     Rate and Rhythm: Normal rate and regular rhythm.     Pulses: Normal pulses.     Heart sounds: Normal heart sounds.  Pulmonary:     Effort: Pulmonary effort is normal.     Breath sounds: Normal breath sounds.  Musculoskeletal:     Right lower leg: No edema.     Left lower leg: No edema.  Neurological:     Mental Status: He is alert and oriented to person, place, and time.  Psychiatric:        Mood and Affect: Mood normal.        Behavior: Behavior normal.        Thought Content: Thought content normal.     Assessment & Plan:  This visit occurred during the SARS-CoV-2 public health emergency.  Safety protocols were in place, including screening questions prior to the visit, additional usage of staff PPE, and extensive cleaning of exam room while observing appropriate contact time as indicated for disinfecting solutions.   [name] was seen today for establish care.  Diagnoses and all orders for this visit:  Essential hypertension -     Basic metabolic panel  Alcohol use disorder, mild, abuse -     Lipid panel -     Hepatic function panel  Elevated random blood glucose level -     Hemoglobin A1c -     Insulin, random  Morbid obesity (HCC) -     Lipid panel -     Hemoglobin A1c -     Insulin, random -     Amb Ref to Medical Weight Management    Problem List Items Addressed This Visit      Cardiovascular and Mediastinum   Essential hypertension - Primary    Mildly elevated today. No LE edema, no headache, no daytime fatigue/somnolence/apneic episodes. Advised about need to minimal ETOH intake and DASH diet. BP Readings from Last 3 Encounters:  07/17/20 140/90  10/13/19 132/84  04/12/19 130/88   Repeat BMP Maintain olmesartan/HCTZ F/up in 45months      Relevant Orders  Basic metabolic panel     Other   Alcohol use disorder, mild, abuse    Denies any withdrawal symptoms without ETOH intake Drinks 7-8cans of beer per day. Advised to cut down 1-2drinks a day or occassional intake.      Relevant Orders   Lipid panel   Hepatic function panel    Other Visit Diagnoses    Elevated random blood glucose level       Relevant Orders   Hemoglobin A1c   Insulin, random   Morbid obesity (HCC)       Relevant Orders   Lipid panel   Hemoglobin A1c   Insulin, random   Amb Ref to Medical Weight Management      Follow-up: Return in about 3 months (around 10/17/2020) for HTN.  Christian Penna, NP

## 2020-07-18 LAB — INSULIN, RANDOM: Insulin: 19.6 u[IU]/mL

## 2020-07-21 ENCOUNTER — Other Ambulatory Visit: Payer: Self-pay | Admitting: Nurse Practitioner

## 2020-07-21 ENCOUNTER — Other Ambulatory Visit: Payer: Self-pay | Admitting: Family Medicine

## 2020-07-21 DIAGNOSIS — I10 Essential (primary) hypertension: Secondary | ICD-10-CM

## 2020-07-21 NOTE — Telephone Encounter (Signed)
Pt mom called stating patient is out of medication and in need of refill.  Last filled on 06/15/2020 with no refills Last seen 07/17/2020 Chart supports Rx and medication sent to confirmed pharmacy.

## 2020-07-23 NOTE — Telephone Encounter (Signed)
Forwarding to PCP for review.  

## 2020-08-12 ENCOUNTER — Other Ambulatory Visit: Payer: Self-pay | Admitting: Nurse Practitioner

## 2020-08-12 DIAGNOSIS — I10 Essential (primary) hypertension: Secondary | ICD-10-CM

## 2020-08-14 NOTE — Telephone Encounter (Signed)
Last OV 07/17/20 Last fill 07/24/20  #90/0

## 2020-10-07 ENCOUNTER — Other Ambulatory Visit: Payer: Self-pay | Admitting: Nurse Practitioner

## 2020-10-07 DIAGNOSIS — I10 Essential (primary) hypertension: Secondary | ICD-10-CM

## 2020-10-17 ENCOUNTER — Ambulatory Visit (INDEPENDENT_AMBULATORY_CARE_PROVIDER_SITE_OTHER): Payer: BC Managed Care – PPO | Admitting: Nurse Practitioner

## 2020-10-17 ENCOUNTER — Other Ambulatory Visit: Payer: Self-pay

## 2020-10-17 ENCOUNTER — Encounter: Payer: Self-pay | Admitting: Nurse Practitioner

## 2020-10-17 VITALS — BP 130/96 | HR 80 | Temp 96.9°F | Ht 71.0 in | Wt 302.0 lb

## 2020-10-17 DIAGNOSIS — E669 Obesity, unspecified: Secondary | ICD-10-CM | POA: Insufficient documentation

## 2020-10-17 DIAGNOSIS — R739 Hyperglycemia, unspecified: Secondary | ICD-10-CM | POA: Diagnosis not present

## 2020-10-17 DIAGNOSIS — I10 Essential (primary) hypertension: Secondary | ICD-10-CM | POA: Diagnosis not present

## 2020-10-17 DIAGNOSIS — F10920 Alcohol use, unspecified with intoxication, uncomplicated: Secondary | ICD-10-CM | POA: Insufficient documentation

## 2020-10-17 DIAGNOSIS — R7401 Elevation of levels of liver transaminase levels: Secondary | ICD-10-CM | POA: Diagnosis not present

## 2020-10-17 DIAGNOSIS — F411 Generalized anxiety disorder: Secondary | ICD-10-CM

## 2020-10-17 DIAGNOSIS — F1010 Alcohol abuse, uncomplicated: Secondary | ICD-10-CM

## 2020-10-17 LAB — HEPATIC FUNCTION PANEL
ALT: 35 U/L (ref 0–53)
AST: 23 U/L (ref 0–37)
Albumin: 4.6 g/dL (ref 3.5–5.2)
Alkaline Phosphatase: 86 U/L (ref 39–117)
Bilirubin, Direct: 0.1 mg/dL (ref 0.0–0.3)
Total Bilirubin: 0.6 mg/dL (ref 0.2–1.2)
Total Protein: 7.2 g/dL (ref 6.0–8.3)

## 2020-10-17 LAB — BASIC METABOLIC PANEL
BUN: 28 mg/dL — ABNORMAL HIGH (ref 6–23)
CO2: 29 mEq/L (ref 19–32)
Calcium: 9.5 mg/dL (ref 8.4–10.5)
Chloride: 101 mEq/L (ref 96–112)
Creatinine, Ser: 0.93 mg/dL (ref 0.40–1.50)
GFR: 112.94 mL/min (ref >60.00)
Glucose, Bld: 96 mg/dL (ref 70–99)
Potassium: 3.8 mEq/L (ref 3.5–5.1)
Sodium: 137 mEq/L (ref 135–145)

## 2020-10-17 LAB — HEMOGLOBIN A1C: Hgb A1c MFr Bld: 5.7 % (ref 4.6–6.5)

## 2020-10-17 MED ORDER — OLMESARTAN MEDOXOMIL-HCTZ 40-25 MG PO TABS: 1.0000 | ORAL_TABLET | Freq: Every day | ORAL | 1 refills | Status: DC

## 2020-10-17 NOTE — Assessment & Plan Note (Signed)
Declined referral to weight loss clinic Advised about recommended diet and exercise regimen.

## 2020-10-17 NOTE — Assessment & Plan Note (Addendum)
Declined use of medication and referral to therapist. SI without plan, no hx of attempt or inpatient admission, no psychosis. Gave verbal contract to call 911 if SI becomes pervasive

## 2020-10-17 NOTE — Progress Notes (Signed)
Subjective:  Patient ID: Christian Wagner, male    DOB: September 09, 1996  Age: 25 y.o. MRN: [id number]  CC: Follow-up (3 month f/u on HTN. Pt does not check BP at home. )  HPI  Essential hypertension Stable BP Repeat BMP Advised about need for DASH diet and exercise. BP Readings from Last 3 Encounters:  10/17/20 (!) 130/96  07/17/20 140/90  10/13/19 132/84   Maintain current medication  Alcohol use disorder, mild, abuse Reports he has cut down to 4-12oz per day. Advise to continue cutting down. Denies any withdrawal symptoms   GAD (generalized anxiety disorder) Declined use of medication and referral to therapist. SI without plan, no hx of attempt or inpatient admission, no psychosis. Gave verbal contract to call 911 if SI becomes pervasive  Morbid obesity (HCC) Declined referral to weight loss clinic Advised about recommended diet and exercise regimen.  Depression screen [facility] 2/9 10/17/2020 04/12/2019  Decreased Interest 0 0  Down, Depressed, Hopeless 1 0  PHQ - 2 Score 1 0  Altered sleeping 1 1  Tired, decreased energy 1 1  Change in appetite 2 0  Feeling bad or failure about yourself  2 1  Trouble concentrating 0 0  Moving slowly or fidgety/restless 0 0  Suicidal thoughts 1 0  PHQ-9 Score 8 3  Difficult doing work/chores Not difficult at all Somewhat difficult   GAD 7 : Generalized Anxiety Score 10/17/2020 04/12/2019  Nervous, Anxious, on Edge 1 2  Control/stop worrying 1 1  Worry too much - different things 0 1  Trouble relaxing 1 1  Restless 0 0  Easily annoyed or irritable 0 0  Afraid - awful might happen 1 1  Total GAD 7 Score 4 6  Anxiety Difficulty Not difficult at all Somewhat difficult   Reviewed past Medical, Social and Family history today.  Outpatient Medications Prior to Visit  Medication Sig Dispense Refill  . olmesartan-hydrochlorothiazide (BENICAR HCT) 40-25 MG tablet Take 1 tablet by mouth daily. 30 tablet 1   No facility-administered  medications prior to visit.    ROS See HPI  Objective:  BP (!) 130/96 (BP Location: Left Arm, Patient Position: Sitting, Cuff Size: Large)   Pulse 80   Temp (!) 96.9 F (36.1 C) (Temporal)   Ht 5\' 11"  (1.803 m)   Wt (!) 302 lb (137 kg)   SpO2 98%   BMI 42.12 kg/m   Physical Exam  Assessment & Plan:  This visit occurred during the SARS-CoV-2 public health emergency.  Safety protocols were in place, including screening questions prior to the visit, additional usage of staff PPE, and extensive cleaning of exam room while observing appropriate contact time as indicated for disinfecting solutions.   [name] was seen today for follow-up.  Diagnoses and all orders for this visit:  Essential hypertension -     Basic metabolic panel -     olmesartan-hydrochlorothiazide (BENICAR HCT) 40-25 MG tablet; Take 1 tablet by mouth daily.  Elevated ALT measurement -     Hepatic function panel  Hyperglycemia -     Hemoglobin A1c  Morbid obesity (HCC)  Alcohol use disorder, mild, abuse  GAD (generalized anxiety disorder)   Problem List Items Addressed This Visit      Cardiovascular and Mediastinum   Essential hypertension - Primary    Stable BP Repeat BMP Advised about need for DASH diet and exercise. BP Readings from Last 3 Encounters:  10/17/20 (!) 130/96  07/17/20 140/90  10/13/19 132/84   Maintain  current medication      Relevant Medications   olmesartan-hydrochlorothiazide (BENICAR HCT) 40-25 MG tablet   Other Relevant Orders   Basic metabolic panel     Other   Alcohol use disorder, mild, abuse    Reports he has cut down to 4-12oz per day. Advise to continue cutting down. Denies any withdrawal symptoms       GAD (generalized anxiety disorder)    Declined use of medication and referral to therapist. SI without plan, no hx of attempt or inpatient admission, no psychosis. Gave verbal contract to call 911 if SI becomes pervasive      Morbid obesity (HCC)     Declined referral to weight loss clinic Advised about recommended diet and exercise regimen.       Other Visit Diagnoses    Elevated ALT measurement       Relevant Orders   Hepatic function panel   Hyperglycemia       Relevant Orders   Hemoglobin A1c      Follow-up: Return in about 6 months (around 04/16/2021) for CPE (fasting).  [name], NP

## 2020-10-17 NOTE — Patient Instructions (Signed)
Go to lab for blood draw  Start daily exercise and heart healthy diet to help improve BP control and promote weight loss  Continue to work on decreasing ETOH intake to 1-12oz per day.   https://www.mata.com/.pdf">  DASH Eating Plan DASH stands for Dietary Approaches to Stop Hypertension. The DASH eating plan is a healthy eating plan that has been shown to:  Reduce high blood pressure (hypertension).  Reduce your risk for type 2 diabetes, heart disease, and stroke.  Help with weight loss. What are tips for following this plan? Reading food labels  Check food labels for the amount of salt (sodium) per serving. Choose foods with less than 5 percent of the Daily Value of sodium. Generally, foods with less than 300 milligrams (mg) of sodium per serving fit into this eating plan.  To find whole grains, look for the word "whole" as the first word in the ingredient list. Shopping  Buy products labeled as "low-sodium" or "no salt added."  Buy fresh foods. Avoid canned foods and pre-made or frozen meals. Cooking  Avoid adding salt when cooking. Use salt-free seasonings or herbs instead of table salt or sea salt. Check with your health care provider or pharmacist before using salt substitutes.  Do not fry foods. Cook foods using healthy methods such as baking, boiling, grilling, roasting, and broiling instead.  Cook with heart-healthy oils, such as olive, canola, avocado, soybean, or sunflower oil. Meal planning  Eat a balanced diet that includes: ? 4 or more servings of fruits and 4 or more servings of vegetables each day. Try to fill one-half of your plate with fruits and vegetables. ? 6-8 servings of whole grains each day. ? Less than 6 oz (170 g) of lean meat, poultry, or fish each day. A 3-oz (85-g) serving of meat is about the same size as a deck of cards. One egg equals 1 oz (28 g). ? 2-3 servings of low-fat dairy each day. One serving is 1  cup (237 mL). ? 1 serving of nuts, seeds, or beans 5 times each week. ? 2-3 servings of heart-healthy fats. Healthy fats called omega-3 fatty acids are found in foods such as walnuts, flaxseeds, fortified milks, and eggs. These fats are also found in cold-water fish, such as sardines, salmon, and mackerel.  Limit how much you eat of: ? Canned or prepackaged foods. ? Food that is high in trans fat, such as some fried foods. ? Food that is high in saturated fat, such as fatty meat. ? Desserts and other sweets, sugary drinks, and other foods with added sugar. ? Full-fat dairy products.  Do not salt foods before eating.  Do not eat more than 4 egg yolks a week.  Try to eat at least 2 vegetarian meals a week.  Eat more home-cooked food and less restaurant, buffet, and fast food.   Lifestyle  When eating at a restaurant, ask that your food be prepared with less salt or no salt, if possible.  If you drink alcohol: ? Limit how much you use to:  0-1 drink a day for women who are not pregnant.  0-2 drinks a day for men. ? Be aware of how much alcohol is in your drink. In the [place] one drink equals one 12 oz bottle of beer (355 mL), one 5 oz glass of wine (148 mL), or one 1 oz glass of hard liquor (44 mL). General information  Avoid eating more than 2,300 mg of salt a day. If you have hypertension,  you may need to reduce your sodium intake to 1,500 mg a day.  Work with your health care provider to maintain a healthy body weight or to lose weight. Ask what an ideal weight is for you.  Get at least 30 minutes of exercise that causes your heart to beat faster (aerobic exercise) most days of the week. Activities may include walking, swimming, or biking.  Work with your health care provider or dietitian to adjust your eating plan to your individual calorie needs. What foods should I eat? Fruits All fresh, dried, or frozen fruit. Canned fruit in natural juice (without added  sugar). Vegetables Fresh or frozen vegetables (raw, steamed, roasted, or grilled). Low-sodium or reduced-sodium tomato and vegetable juice. Low-sodium or reduced-sodium tomato sauce and tomato paste. Low-sodium or reduced-sodium canned vegetables. Grains Whole-grain or whole-wheat bread. Whole-grain or whole-wheat pasta. Brown rice. Orpah Cobb. Bulgur. Whole-grain and low-sodium cereals. Pita bread. Low-fat, low-sodium crackers. Whole-wheat flour tortillas. Meats and other proteins Skinless chicken or [place]. Ground chicken or [place]. Pork with fat trimmed off. Fish and seafood. Egg whites. Dried beans, peas, or lentils. Unsalted nuts, nut butters, and seeds. Unsalted canned beans. Lean cuts of beef with fat trimmed off. Low-sodium, lean precooked or cured meat, such as sausages or meat loaves. Dairy Low-fat (1%) or fat-free (skim) milk. Reduced-fat, low-fat, or fat-free cheeses. Nonfat, low-sodium ricotta or cottage cheese. Low-fat or nonfat yogurt. Low-fat, low-sodium cheese. Fats and oils Soft margarine without trans fats. Vegetable oil. Reduced-fat, low-fat, or light mayonnaise and salad dressings (reduced-sodium). Canola, safflower, olive, avocado, soybean, and sunflower oils. Avocado. Seasonings and condiments Herbs. Spices. Seasoning mixes without salt. Other foods Unsalted popcorn and pretzels. Fat-free sweets. The items listed above may not be a complete list of foods and beverages you can eat. Contact a dietitian for more information. What foods should I avoid? Fruits Canned fruit in a light or heavy syrup. Fried fruit. Fruit in cream or butter sauce. Vegetables Creamed or fried vegetables. Vegetables in a cheese sauce. Regular canned vegetables (not low-sodium or reduced-sodium). Regular canned tomato sauce and paste (not low-sodium or reduced-sodium). Regular tomato and vegetable juice (not low-sodium or reduced-sodium). Rosita Fire. Olives. Grains Baked goods made with fat, such  as croissants, muffins, or some breads. Dry pasta or rice meal packs. Meats and other proteins Fatty cuts of meat. Ribs. Fried meat. Tomasa Blase. Bologna, salami, and other precooked or cured meats, such as sausages or meat loaves. Fat from the back of a pig (fatback). Bratwurst. Salted nuts and seeds. Canned beans with added salt. Canned or smoked fish. Whole eggs or egg yolks. Chicken or [place] with skin. Dairy Whole or 2% milk, cream, and half-and-half. Whole or full-fat cream cheese. Whole-fat or sweetened yogurt. Full-fat cheese. Nondairy creamers. Whipped toppings. Processed cheese and cheese spreads. Fats and oils Butter. Stick margarine. Lard. Shortening. Ghee. Bacon fat. Tropical oils, such as coconut, palm kernel, or palm oil. Seasonings and condiments Onion salt, garlic salt, seasoned salt, table salt, and sea salt. Worcestershire sauce. Tartar sauce. Barbecue sauce. Teriyaki sauce. Soy sauce, including reduced-sodium. Steak sauce. Canned and packaged gravies. Fish sauce. Oyster sauce. Cocktail sauce. Store-bought horseradish. Ketchup. Mustard. Meat flavorings and tenderizers. Bouillon cubes. Hot sauces. Pre-made or packaged marinades. Pre-made or packaged taco seasonings. Relishes. Regular salad dressings. Other foods Salted popcorn and pretzels. The items listed above may not be a complete list of foods and beverages you should avoid. Contact a dietitian for more information. Where to find more information  National Heart, Lung,  and Blood Institute: PopSteam.is  American Heart Association: www.heart.org  Academy of Nutrition and Dietetics: www.eatright.org  National Kidney Foundation: www.kidney.org Summary  The DASH eating plan is a healthy eating plan that has been shown to reduce high blood pressure (hypertension). It may also reduce your risk for type 2 diabetes, heart disease, and stroke.  When on the DASH eating plan, aim to eat more fresh fruits and vegetables, whole  grains, lean proteins, low-fat dairy, and heart-healthy fats.  With the DASH eating plan, you should limit salt (sodium) intake to 2,300 mg a day. If you have hypertension, you may need to reduce your sodium intake to 1,500 mg a day.  Work with your health care provider or dietitian to adjust your eating plan to your individual calorie needs. This information is not intended to replace advice given to you by your health care provider. Make sure you discuss any questions you have with your health care provider. Document Revised: 08/13/2019 Document Reviewed: 08/13/2019 Elsevier Patient Education  2021 [employer].

## 2020-10-17 NOTE — Assessment & Plan Note (Signed)
Stable BP Repeat BMP Advised about need for DASH diet and exercise. BP Readings from Last 3 Encounters:  10/17/20 (!) 130/96  07/17/20 140/90  10/13/19 132/84   Maintain current medication

## 2020-10-17 NOTE — Assessment & Plan Note (Signed)
Reports he has cut down to 4-12oz per day. Advise to continue cutting down. Denies any withdrawal symptoms

## 2020-10-21 ENCOUNTER — Encounter (INDEPENDENT_AMBULATORY_CARE_PROVIDER_SITE_OTHER): Payer: Self-pay

## 2021-04-26 ENCOUNTER — Other Ambulatory Visit: Payer: Self-pay

## 2021-04-26 ENCOUNTER — Ambulatory Visit (INDEPENDENT_AMBULATORY_CARE_PROVIDER_SITE_OTHER): Payer: BC Managed Care – PPO | Admitting: Nurse Practitioner

## 2021-04-26 ENCOUNTER — Encounter: Payer: Self-pay | Admitting: Nurse Practitioner

## 2021-04-26 VITALS — BP 132/70 | HR 84 | Temp 98.2°F | Ht 72.0 in | Wt 277.2 lb

## 2021-04-26 DIAGNOSIS — F411 Generalized anxiety disorder: Secondary | ICD-10-CM

## 2021-04-26 DIAGNOSIS — F1010 Alcohol abuse, uncomplicated: Secondary | ICD-10-CM

## 2021-04-26 DIAGNOSIS — I10 Essential (primary) hypertension: Secondary | ICD-10-CM

## 2021-04-26 DIAGNOSIS — Z0001 Encounter for general adult medical examination with abnormal findings: Secondary | ICD-10-CM | POA: Diagnosis not present

## 2021-04-26 DIAGNOSIS — E7800 Pure hypercholesterolemia, unspecified: Secondary | ICD-10-CM

## 2021-04-26 DIAGNOSIS — R739 Hyperglycemia, unspecified: Secondary | ICD-10-CM | POA: Diagnosis not present

## 2021-04-26 LAB — CBC
HCT: 41.2 % (ref 39.0–52.0)
Hemoglobin: 13.8 g/dL (ref 13.0–17.0)
MCHC: 33.4 g/dL (ref 30.0–36.0)
MCV: 87.8 fl (ref 78.0–100.0)
Platelets: 208 10*3/uL (ref 150.0–400.0)
RBC: 4.69 Mil/uL (ref 4.22–5.81)
RDW: 13.3 % (ref 11.5–15.5)
WBC: 6.4 10*3/uL (ref 4.0–10.5)

## 2021-04-26 LAB — LIPID PANEL
Cholesterol: 153 mg/dL (ref 0–200)
HDL: 36.3 mg/dL — ABNORMAL LOW (ref >39.00)
LDL Cholesterol: 101 mg/dL — ABNORMAL HIGH (ref 0–99)
NonHDL: 116.51
Total CHOL/HDL Ratio: 4
Triglycerides: 79 mg/dL (ref 0.0–149.0)
VLDL: 15.8 mg/dL (ref 0.0–40.0)

## 2021-04-26 LAB — BASIC METABOLIC PANEL
BUN: 25 mg/dL — ABNORMAL HIGH (ref 6–23)
CO2: 23 mEq/L (ref 19–32)
Calcium: 9.1 mg/dL (ref 8.4–10.5)
Chloride: 101 mEq/L (ref 96–112)
Creatinine, Ser: 1.01 mg/dL (ref 0.40–1.50)
GFR: 101.92 mL/min (ref >60.00)
Glucose, Bld: 94 mg/dL (ref 70–99)
Potassium: 4 mEq/L (ref 3.5–5.1)
Sodium: 136 mEq/L (ref 135–145)

## 2021-04-26 LAB — HEPATIC FUNCTION PANEL
ALT: 21 U/L (ref 0–53)
AST: 24 U/L (ref 0–37)
Albumin: 4.5 g/dL (ref 3.5–5.2)
Alkaline Phosphatase: 84 U/L (ref 39–117)
Bilirubin, Direct: 0.1 mg/dL (ref 0.0–0.3)
Total Bilirubin: 0.7 mg/dL (ref 0.2–1.2)
Total Protein: 7.2 g/dL (ref 6.0–8.3)

## 2021-04-26 LAB — TSH: TSH: 2.73 u[IU]/mL (ref 0.35–5.50)

## 2021-04-26 LAB — HEMOGLOBIN A1C: Hgb A1c MFr Bld: 5.6 % (ref 4.6–6.5)

## 2021-04-26 NOTE — Progress Notes (Signed)
Subjective:    Patient ID: Christian Wagner, male    DOB: 07-Jan-1996, 25 y.o.   MRN: [id number]  Patient presents today for CPE and eval of chronic conditions  HPI GAD (generalized anxiety disorder) Stable mood Denies any SI/HI or hallucination Denies need for medication or psychology referral  Essential hypertension BP at goal BP Readings from Last 3 Encounters:  04/26/21 132/70  10/17/20 (!) 130/96  07/17/20 140/90   Repeat BMP  Morbid obesity (HCC) Continuous weight loss through low fat diet and daily exercise (cardio and weight training). States he has also decrease ETOH consumption Wt Readings from Last 3 Encounters:  04/26/21 277 lb 3.2 oz (125.7 kg)  10/17/20 (!) 302 lb (137 kg)  07/17/20 (!) 316 lb 3.2 oz (143.4 kg)   Encourage to increase cardio exercise to , continue heart healthy diet and decrease daily calorie intake, and continue to cue down of ETOH consumption  Alcohol use disorder, mild, abuse States he has decrease ETOH consumption to 3-12oz per day  Vision:not needed per patient Dental:declined to schedule Diet:heart healthy Exercise:daily cardio and weight training Weight:  Wt Readings from Last 3 Encounters:  04/26/21 277 lb 3.2 oz (125.7 kg)  10/17/20 (!) 302 lb (137 kg)  07/17/20 (!) 316 lb 3.2 oz (143.4 kg)   Sexual History (orientation,birth control, marital status, STD): not sexually active at this time, denies need for STD screen today  He also declined all immunizations at this time despite the benefits.  Depression/Suicide: Depression screen [facility] 2/9 04/26/2021 10/17/2020 04/12/2019  Decreased Interest 1 0 0  Down, Depressed, Hopeless 1 1 0  PHQ - 2 Score 2 1 0  Altered sleeping 1 1 1   Tired, decreased energy 1 1 1   Change in appetite 2 2 0  Feeling bad or failure about yourself  1 2 1   Trouble concentrating 0 0 0  Moving slowly or fidgety/restless 0 0 0  Suicidal thoughts 0 1 0  PHQ-9 Score 7 8 3   Difficult doing  work/chores Not difficult at all Not difficult at all Somewhat difficult   Immunizations: (TDAP, Hep C screen, Pneumovax, Influenza, zoster)  Health Maintenance  Topic Date Due   Flu Shot  04/23/2021   COVID-19 Vaccine (2 - Pfizer series) 05/12/2021*   Pneumococcal Vaccination (1 - PCV) 04/26/2022*   Hepatitis C Screening: USPSTF Recommendation to screen - Ages 18-79 yo.  04/26/2022*   HIV Screening  04/26/2022*   Tetanus Vaccine  03/20/2022   HPV Vaccine  Aged Out  *Topic was postponed. The date shown is not the original due date.   Fall Risk: Fall Risk  04/26/2021  Falls in the past year? 0  Number falls in past yr: 0  Injury with Fall? 0  Risk for fall due to : No Fall Risks   Medications and allergies reviewed with patient and updated if appropriate.  Patient Active Problem List   Diagnosis Date Noted   Alcoholic intoxication without complication (HCC) 10/17/2020   Morbid obesity (HCC) 10/17/2020   GAD (generalized anxiety disorder) 10/13/2019   Alcohol use disorder, mild, abuse 04/12/2019   Essential hypertension 01/22/2019   Current Outpatient Medications on File Prior to Visit  Medication Sig Dispense Refill   Multiple Vitamin (MULTIVITAMIN) tablet Take 1 tablet by mouth daily.     olmesartan-hydrochlorothiazide (BENICAR HCT) 40-25 MG tablet Take 1 tablet by mouth daily. 90 tablet 1   No current facility-administered medications on file prior to visit.   Past Medical  History:  Diagnosis Date   Epilepsy (HCC)    No past surgical history on file.  Social History   Socioeconomic History   Marital status: Single    Spouse name: Not on file   Number of children: Not on file   Years of education: Not on file   Highest education level: Not on file  Occupational History   Not on file  Tobacco Use   Smoking status: Former   Smokeless tobacco: Never  Substance and Sexual Activity   Alcohol use: Yes    Comment: soically    Drug use: No   Sexual activity: Not on  file  Other Topics Concern   Not on file  Social History Narrative   Not on file   Social Determinants of Health   Financial Resource Strain: Not on file  Food Insecurity: Not on file  Transportation Needs: Not on file  Physical Activity: Not on file  Stress: Not on file  Social Connections: Not on file    Family History  Problem Relation Age of Onset   Hypertension Maternal Uncle    Hypertension Maternal Grandmother        Review of Systems  Constitutional:  Negative for fever, malaise/fatigue and weight loss.  HENT:  Negative for congestion and sore throat.   Eyes:        Negative for visual changes  Respiratory:  Negative for cough and shortness of breath.   Cardiovascular:  Negative for chest pain, palpitations and leg swelling.  Gastrointestinal:  Negative for blood in stool, constipation, diarrhea and heartburn.  Genitourinary:  Negative for dysuria, frequency and urgency.  Musculoskeletal:  Negative for falls, joint pain and myalgias.  Skin:  Negative for rash.  Neurological:  Negative for dizziness, sensory change and headaches.  Endo/Heme/Allergies:  Does not bruise/bleed easily.  Psychiatric/Behavioral:  Negative for depression, substance abuse and suicidal ideas. The patient is not nervous/anxious.    Objective:   Vitals:   04/26/21 0825  BP: 132/70  Pulse: 84  Temp: 98.2 F (36.8 C)  SpO2: 99%   Body mass index is 37.6 kg/m.  Physical Examination:  Physical Exam Vitals reviewed.  Constitutional:      General: He is not in acute distress.    Appearance: He is well-developed. He is obese.  HENT:     Right Ear: Tympanic membrane, ear canal and external ear normal.     Left Ear: Tympanic membrane, ear canal and external ear normal.  Eyes:     Extraocular Movements: Extraocular movements intact.     Conjunctiva/sclera: Conjunctivae normal.  Cardiovascular:     Rate and Rhythm: Normal rate and regular rhythm.     Pulses: Normal pulses.      Heart sounds: Normal heart sounds.  Pulmonary:     Effort: Pulmonary effort is normal. No respiratory distress.     Breath sounds: Normal breath sounds.  Chest:     Chest wall: No tenderness.  Abdominal:     General: Bowel sounds are normal.     Palpations: Abdomen is soft.  Musculoskeletal:        General: Normal range of motion.     Cervical back: Normal range of motion and neck supple.     Right lower leg: No edema.     Left lower leg: No edema.  Lymphadenopathy:     Cervical: No cervical adenopathy.  Skin:    General: Skin is warm and dry.  Neurological:     Mental Status:  He is alert and oriented to person, place, and time.     Deep Tendon Reflexes: Reflexes are normal and symmetric.  Psychiatric:        Mood and Affect: Mood normal.        Behavior: Behavior normal.        Thought Content: Thought content normal.   ASSESSMENT and PLAN: This visit occurred during the SARS-CoV-2 public health emergency.  Safety protocols were in place, including screening questions prior to the visit, additional usage of staff PPE, and extensive cleaning of exam room while observing appropriate contact time as indicated for disinfecting solutions.   Drequan was seen today for annual exam.  Diagnoses and all orders for this visit:  Encounter for preventative adult health care exam with abnormal findings -     Hepatic function panel -     Basic metabolic panel -     CBC -     TSH  Essential hypertension  GAD (generalized anxiety disorder)  Hyperglycemia -     Hemoglobin A1c  Pure hypercholesterolemia -     Lipid panel  Morbid obesity (HCC)  Alcohol use disorder, mild, abuse     Problem List Items Addressed This Visit       Cardiovascular and Mediastinum   Essential hypertension    BP at goal BP Readings from Last 3 Encounters:  04/26/21 132/70  10/17/20 (!) 130/96  07/17/20 140/90   Repeat BMP        Other   Alcohol use disorder, mild, abuse    States he has  decrease ETOH consumption to 3-12oz per day       GAD (generalized anxiety disorder)    Stable mood Denies any SI/HI or hallucination Denies need for medication or psychology referral       Morbid obesity (HCC)    Continuous weight loss through low fat diet and daily exercise (cardio and weight training). States he has also decrease ETOH consumption Wt Readings from Last 3 Encounters:  04/26/21 277 lb 3.2 oz (125.7 kg)  10/17/20 (!) 302 lb (137 kg)  07/17/20 (!) 316 lb 3.2 oz (143.4 kg)   Encourage to increase cardio exercise to , continue heart healthy diet and decrease daily calorie intake, and continue to cue down of ETOH consumption      Other Visit Diagnoses     Encounter for preventative adult health care exam with abnormal findings    -  Primary   Relevant Orders   Hepatic function panel   Basic metabolic panel   CBC   TSH   Hyperglycemia       Relevant Orders   Hemoglobin A1c   Pure hypercholesterolemia       Relevant Orders   Lipid panel       Follow up: Return in about 6 months (around 10/27/2021) for HTN and weight management.  [name], NP

## 2021-04-26 NOTE — Assessment & Plan Note (Signed)
Stable mood Denies any SI/HI or hallucination Denies need for medication or psychology referral

## 2021-04-26 NOTE — Patient Instructions (Signed)
Keep up the good work with cutting qown ETOH consumption, maintain heart healthy diet and daily exercise. Try to maintain at least 30-47mins of cardio exercise daily  Go to lab for blood draw.  Preventive Care 93-27 Years Old, Male Preventive care refers to lifestyle choices and visits with your health care provider that can promote health and wellness. This includes: A yearly physical exam. This is also called an annual wellness visit. Regular dental and eye exams. Immunizations. Screening for certain conditions. Healthy lifestyle choices, such as: Eating a healthy diet. Getting regular exercise. Not using drugs or products that contain nicotine and tobacco. Limiting alcohol use. What can I expect for my preventive care visit? Physical exam Your health care provider may check your: Height and weight. These may be used to calculate your BMI (body mass index). BMI is a measurement that tells if you are at a healthy weight. Heart rate and blood pressure. Body temperature. Skin for abnormal spots. Counseling Your health care provider may ask you questions about your: Past medical problems. Family's medical history. Alcohol, tobacco, and drug use. Emotional well-being. Home life and relationship well-being. Sexual activity. Diet, exercise, and sleep habits. Work and work Astronomer. Access to firearms. What immunizations do I need?  Vaccines are usually given at various ages, according to a schedule. Your health care provider will recommend vaccines for you based on your age, medicalhistory, and lifestyle or other factors, such as travel or where you work. What tests do I need? Blood tests Lipid and cholesterol levels. These may be checked every 5 years starting at age 11. Hepatitis C test. Hepatitis B test. Screening  Diabetes screening. This is done by checking your blood sugar (glucose) after you have not eaten for a while (fasting). Genital exam to check for testicular  cancer or hernias. STD (sexually transmitted disease) testing, if you are at risk. Talk with your health care provider about your test results, treatment options,and if necessary, the need for more tests. Follow these instructions at home: Eating and drinking  Eat a healthy diet that includes fresh fruits and vegetables, whole grains, lean protein, and low-fat dairy products. Drink enough fluid to keep your urine pale yellow. Take vitamin and mineral supplements as recommended by your health care provider. Do not drink alcohol if your health care provider tells you not to drink. If you drink alcohol: Limit how much you have to 0-2 drinks a day. Be aware of how much alcohol is in your drink. In the [place] one drink equals one 12 oz bottle of beer (355 mL), one 5 oz glass of wine (148 mL), or one 1 oz glass of hard liquor (44 mL).  Lifestyle Take daily care of your teeth and gums. Brush your teeth every morning and night with fluoride toothpaste. Floss one time each day. Stay active. Exercise for at least 30 minutes 5 or more days each week. Do not use any products that contain nicotine or tobacco, such as cigarettes, e-cigarettes, and chewing tobacco. If you need help quitting, ask your health care provider. Do not use drugs. If you are sexually active, practice safe sex. Use a condom or other form of protection to prevent STIs (sexually transmitted infections). Find healthy ways to cope with stress, such as: Meditation, yoga, or listening to music. Journaling. Talking to a trusted person. Spending time with friends and family. Safety Always wear your seat belt while driving or riding in a vehicle. Do not drive: If you have been drinking alcohol. Do  not ride with someone who has been drinking. When you are tired or distracted. While texting. Wear a helmet and other protective equipment during sports activities. If you have firearms in your house, make sure you follow all gun safety  procedures. Seek help if you have been physically or sexually abused. What's next? Go to your health care provider once a year for an annual wellness visit. Ask your health care provider how often you should have your eyes and teeth checked. Stay up to date on all vaccines. This information is not intended to replace advice given to you by your health care provider. Make sure you discuss any questions you have with your healthcare provider. Document Revised: 05/26/2019 Document Reviewed: 09/03/2018 Elsevier Patient Education  2022 [employer].

## 2021-04-26 NOTE — Assessment & Plan Note (Addendum)
BP at goal BP Readings from Last 3 Encounters:  04/26/21 132/70  10/17/20 (!) 130/96  07/17/20 140/90   Repeat BMP

## 2021-04-26 NOTE — Assessment & Plan Note (Signed)
Continuous weight loss through low fat diet and daily exercise (cardio and weight training). States he has also decrease ETOH consumption Wt Readings from Last 3 Encounters:  04/26/21 277 lb 3.2 oz (125.7 kg)  10/17/20 (!) 302 lb (137 kg)  07/17/20 (!) 316 lb 3.2 oz (143.4 kg)   Encourage to increase cardio exercise to , continue heart healthy diet and decrease daily calorie intake, and continue to cue down of ETOH consumption

## 2021-04-26 NOTE — Assessment & Plan Note (Signed)
States he has decrease ETOH consumption to 3-12oz per day

## 2021-05-05 ENCOUNTER — Other Ambulatory Visit: Payer: Self-pay | Admitting: Nurse Practitioner

## 2021-05-05 DIAGNOSIS — I10 Essential (primary) hypertension: Secondary | ICD-10-CM

## 2021-05-07 NOTE — Telephone Encounter (Signed)
Chart supports Rx Last seen 04/26/21 Next OV 10/2021

## 2021-10-30 ENCOUNTER — Encounter: Payer: Self-pay | Admitting: Nurse Practitioner

## 2021-10-30 ENCOUNTER — Other Ambulatory Visit: Payer: Self-pay

## 2021-10-30 ENCOUNTER — Ambulatory Visit (INDEPENDENT_AMBULATORY_CARE_PROVIDER_SITE_OTHER): Payer: BC Managed Care – PPO | Admitting: Nurse Practitioner

## 2021-10-30 VITALS — BP 134/78 | HR 84 | Temp 96.7°F | Ht 72.0 in | Wt 251.0 lb

## 2021-10-30 DIAGNOSIS — I10 Essential (primary) hypertension: Secondary | ICD-10-CM

## 2021-10-30 DIAGNOSIS — Z6834 Body mass index (BMI) 34.0-34.9, adult: Secondary | ICD-10-CM | POA: Diagnosis not present

## 2021-10-30 DIAGNOSIS — E6609 Other obesity due to excess calories: Secondary | ICD-10-CM | POA: Diagnosis not present

## 2021-10-30 DIAGNOSIS — F1010 Alcohol abuse, uncomplicated: Secondary | ICD-10-CM

## 2021-10-30 LAB — BASIC METABOLIC PANEL
BUN: 27 mg/dL — ABNORMAL HIGH (ref 6–23)
CO2: 29 mEq/L (ref 19–32)
Calcium: 9.9 mg/dL (ref 8.4–10.5)
Chloride: 102 mEq/L (ref 96–112)
Creatinine, Ser: 1.04 mg/dL (ref 0.40–1.50)
GFR: 98.05 mL/min (ref >60.00)
Glucose, Bld: 96 mg/dL (ref 70–99)
Potassium: 3.5 mEq/L (ref 3.5–5.1)
Sodium: 137 mEq/L (ref 135–145)

## 2021-10-30 LAB — LIPID PANEL
Cholesterol: 154 mg/dL (ref 0–200)
HDL: 40.7 mg/dL (ref >39.00)
LDL Cholesterol: 100 mg/dL — ABNORMAL HIGH (ref 0–99)
NonHDL: 113.25
Total CHOL/HDL Ratio: 4
Triglycerides: 64 mg/dL (ref 0.0–149.0)
VLDL: 12.8 mg/dL (ref 0.0–40.0)

## 2021-10-30 MED ORDER — OLMESARTAN MEDOXOMIL-HCTZ 40-25 MG PO TABS: 1.0000 | ORAL_TABLET | Freq: Every day | ORAL | 1 refills | Status: DC

## 2021-10-30 NOTE — Patient Instructions (Signed)
Maintain current medications Go to lab for blood draw 

## 2021-10-30 NOTE — Assessment & Plan Note (Signed)
BP at goal with Benicar HCTZ BP Readings from Last 3 Encounters:  10/30/21 134/78  04/26/21 132/70  10/17/20 (!) 130/96   Maintain med dose Repeat BMP

## 2021-10-30 NOTE — Progress Notes (Signed)
Subjective:  Patient ID: [name], male    DOB: 1996/01/19  Age: 26 y.o. MRN: [id number]  CC: Follow-up (6 month f/u on HTN and weight. Pt does not check BP at home. Christian Wagner been making lifestyle changes to help with weight (cooking more, less eating out, less alcohol, and some exercise). )  HPI  Alcohol use disorder, mild, abuse Decreased to 2-12oz per day  Denies any withdrawal symptoms  Obesity He continues to loss weight through diet modification (low fat and high protein) and exercise (cardio and weight training). He has lost 26lbs in last 80months. Wt Readings from Last 3 Encounters:  10/30/21 251 lb (113.9 kg)  04/26/21 277 lb 3.2 oz (125.7 kg)  10/17/20 (!) 302 lb (137 kg)    Essential hypertension BP at goal with Benicar HCTZ BP Readings from Last 3 Encounters:  10/30/21 134/78  04/26/21 132/70  10/17/20 (!) 130/96   Maintain med dose Repeat BMP  Wt Readings from Last 3 Encounters:  10/30/21 251 lb (113.9 kg)  04/26/21 277 lb 3.2 oz (125.7 kg)  10/17/20 (!) 302 lb (137 kg)    BP Readings from Last 3 Encounters:  10/30/21 134/78  04/26/21 132/70  10/17/20 (!) 130/96    Reviewed past Medical, Social and Family history today.  Outpatient Medications Prior to Visit  Medication Sig Dispense Refill   Multiple Vitamin (MULTIVITAMIN) tablet Take 1 tablet by mouth daily.     olmesartan-hydrochlorothiazide (BENICAR HCT) 40-25 MG tablet TAKE 1 TABLET BY MOUTH EVERY DAY 90 tablet 1   No facility-administered medications prior to visit.   ROS See HPI  Objective:  BP 134/78 (BP Location: Left Arm, Patient Position: Sitting, Cuff Size: Large)    Pulse 84    Temp (!) 96.7 F (35.9 C) (Temporal)    Ht 6' (1.829 m)    Wt 251 lb (113.9 kg)    SpO2 98%    BMI 34.04 kg/m   Physical Exam  Assessment & Plan:  This visit occurred during the SARS-CoV-2 public health emergency.  Safety protocols were in place, including screening questions prior to the visit,  additional usage of staff PPE, and extensive cleaning of exam room while observing appropriate contact time as indicated for disinfecting solutions.   Christian Wagner was seen today for follow-up.  Diagnoses and all orders for this visit:  Essential hypertension -     Basic metabolic panel  Class 1 obesity due to excess calories with serious comorbidity and body mass index (BMI) of 34.0 to 34.9 in adult -     Lipid panel  Alcohol use disorder, mild, abuse   Problem List Items Addressed This Visit       Cardiovascular and Mediastinum   Essential hypertension - Primary    BP at goal with Benicar HCTZ BP Readings from Last 3 Encounters:  10/30/21 134/78  04/26/21 132/70  10/17/20 (!) 130/96   Maintain med dose Repeat BMP      Relevant Orders   Basic metabolic panel (Completed)     Other   Alcohol use disorder, mild, abuse    Decreased to 2-12oz per day  Denies any withdrawal symptoms      Obesity    He continues to loss weight through diet modification (low fat and high protein) and exercise (cardio and weight training). He has lost 26lbs in last 60months. Wt Readings from Last 3 Encounters:  10/30/21 251 lb (113.9 kg)  04/26/21 277 lb 3.2 oz (125.7 kg)  10/17/20 Marland Kitchen)  302 lb (137 kg)        Relevant Orders   Lipid panel (Completed)    Follow-up: Return in about 6 months (around 04/29/2022) for HTN.  [name], NP

## 2021-10-30 NOTE — Assessment & Plan Note (Signed)
Decreased to 2-12oz per day  Denies any withdrawal symptoms

## 2021-10-30 NOTE — Assessment & Plan Note (Signed)
He continues to loss weight through diet modification (low fat and high protein) and exercise (cardio and weight training). He has lost 26lbs in last 81months. Wt Readings from Last 3 Encounters:  10/30/21 251 lb (113.9 kg)  04/26/21 277 lb 3.2 oz (125.7 kg)  10/17/20 (!) 302 lb (137 kg)

## 2022-04-30 ENCOUNTER — Encounter: Payer: Self-pay | Admitting: Nurse Practitioner

## 2022-04-30 ENCOUNTER — Ambulatory Visit (INDEPENDENT_AMBULATORY_CARE_PROVIDER_SITE_OTHER): Payer: BC Managed Care – PPO | Admitting: Nurse Practitioner

## 2022-04-30 VITALS — BP 138/88 | HR 55 | Temp 98.2°F | Ht 72.0 in | Wt 240.6 lb

## 2022-04-30 DIAGNOSIS — Z6832 Body mass index (BMI) 32.0-32.9, adult: Secondary | ICD-10-CM | POA: Diagnosis not present

## 2022-04-30 DIAGNOSIS — E6609 Other obesity due to excess calories: Secondary | ICD-10-CM | POA: Diagnosis not present

## 2022-04-30 DIAGNOSIS — I10 Essential (primary) hypertension: Secondary | ICD-10-CM

## 2022-04-30 LAB — BASIC METABOLIC PANEL
BUN: 30 mg/dL — ABNORMAL HIGH (ref 6–23)
CO2: 24 mEq/L (ref 19–32)
Calcium: 9.4 mg/dL (ref 8.4–10.5)
Chloride: 103 mEq/L (ref 96–112)
Creatinine, Ser: 0.95 mg/dL (ref 0.40–1.50)
GFR: 108.91 mL/min (ref >60.00)
Glucose, Bld: 104 mg/dL — ABNORMAL HIGH (ref 70–99)
Potassium: 3.6 mEq/L (ref 3.5–5.1)
Sodium: 139 mEq/L (ref 135–145)

## 2022-04-30 MED ORDER — OLMESARTAN MEDOXOMIL-HCTZ 40-25 MG PO TABS: 1.0000 | ORAL_TABLET | Freq: Every day | ORAL | 1 refills | Status: DC

## 2022-04-30 NOTE — Assessment & Plan Note (Signed)
BP at goal with olmesartan/hctz BP Readings from Last 3 Encounters:  04/30/22 138/88  10/30/21 134/78  04/26/21 132/70   Maintain med dose Repeat BMP

## 2022-04-30 NOTE — Assessment & Plan Note (Signed)
Has maintain high protein diet, and daily exercise  (cardio and weight training) to facilitate weight loss. Wt Readings from Last 3 Encounters:  04/30/22 240 lb 9.6 oz (109.1 kg)  10/30/21 251 lb (113.9 kg)  04/26/21 277 lb 3.2 oz (125.7 kg)

## 2022-04-30 NOTE — Patient Instructions (Signed)
Maintain current medication Increase oral hydration especially with the nature of your job. Goal is at least 64oz of water per day with 1-2bottles of gartorade.

## 2022-04-30 NOTE — Progress Notes (Signed)
Established Patient Visit  Patient: Christian Wagner   DOB: 01/02/1996   26 y.o. Male  MRN: [id number] Visit Date: 04/30/2022  Subjective:    Chief Complaint  Patient presents with   Office Visit    HTN F/u Doesn't check BP often  C/o lightheadedness at work x 3 months    HPI He reports lightheadedness at work (bending over) and during [employer] training exercise. Onset in June. Symptoms resolved within seconds with rest. 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 He drinks 4-16oz bottle of water per day. No palpitation, no CP, no syncope, no headache.  Essential hypertension BP at goal with olmesartan/hctz BP Readings from Last 3 Encounters:  04/30/22 138/88  10/30/21 134/78  04/26/21 132/70   Maintain med dose Repeat BMP  Obesity Has maintain high protein diet, and daily exercise  (cardio and weight training) to facilitate weight loss. Wt Readings from Last 3 Encounters:  04/30/22 240 lb 9.6 oz (109.1 kg)  10/30/21 251 lb (113.9 kg)  04/26/21 277 lb 3.2 oz (125.7 kg)   Wt Readings from Last 3 Encounters:  04/30/22 240 lb 9.6 oz (109.1 kg)  10/30/21 251 lb (113.9 kg)  04/26/21 277 lb 3.2 oz (125.7 kg)    Reviewed medical, surgical, and social history today  Medications: Outpatient Medications Prior to Visit  Medication Sig   Multiple Vitamin (MULTIVITAMIN) tablet Take 1 tablet by mouth daily.   [DISCONTINUED] olmesartan-hydrochlorothiazide (BENICAR HCT) 40-25 MG tablet Take 1 tablet by mouth daily.   No facility-administered medications prior to visit.   Reviewed past medical and social history.   ROS per HPI above      Objective:  BP 138/88 (BP Location: Right Arm, Patient Position: Sitting, Cuff Size: Normal)   Pulse (!) 55   Temp 98.2 F (36.8 C) (Temporal)   Ht 6' (1.829 m)   Wt 240 lb 9.6 oz (109.1 kg)   SpO2 98%   BMI 32.63 kg/m      Physical Exam Cardiovascular:     Rate and Rhythm: Normal rate and  regular rhythm.     Pulses: Normal pulses.     Heart sounds: Normal heart sounds.  Pulmonary:     Effort: Pulmonary effort is normal.     Breath sounds: Normal breath sounds.  Musculoskeletal:     Right lower leg: No edema.     Left lower leg: No edema.  Neurological:     Mental Status: He is alert and oriented to person, place, and time.     No results found for any visits on 04/30/22.    Assessment & Plan:    Problem List Items Addressed This Visit       Cardiovascular and Mediastinum   Essential hypertension - Primary    BP at goal with olmesartan/hctz BP Readings from Last 3 Encounters:  04/30/22 138/88  10/30/21 134/78  04/26/21 132/70   Maintain med dose Repeat BMP      Relevant Medications   olmesartan-hydrochlorothiazide (BENICAR HCT) 40-25 MG tablet   Other Relevant Orders   Basic metabolic panel     Other   Obesity    Has maintain high protein diet, and daily exercise  (cardio and weight training) to facilitate weight loss. Wt Readings from Last 3 Encounters:  04/30/22 240 lb 9.6 oz (109.1 kg)  10/30/21 251 lb (113.9 kg)  04/26/21 277 lb 3.2 oz (  125.7 kg)       Lightheadedness due to poor hydration in hot work environment. Advised to increase oral hydration and gartorade.  Return in about 6 months (around 10/31/2022) for CPE (fasting).     [name], NP

## 2022-05-01 ENCOUNTER — Encounter (INDEPENDENT_AMBULATORY_CARE_PROVIDER_SITE_OTHER): Payer: Self-pay

## 2022-10-27 ENCOUNTER — Other Ambulatory Visit: Payer: Self-pay | Admitting: Nurse Practitioner

## 2022-10-27 DIAGNOSIS — I10 Essential (primary) hypertension: Secondary | ICD-10-CM

## 2022-11-01 ENCOUNTER — Telehealth: Payer: Self-pay

## 2022-11-01 ENCOUNTER — Ambulatory Visit (INDEPENDENT_AMBULATORY_CARE_PROVIDER_SITE_OTHER): Payer: BC Managed Care – PPO | Admitting: Nurse Practitioner

## 2022-11-01 ENCOUNTER — Encounter: Payer: Self-pay | Admitting: Nurse Practitioner

## 2022-11-01 VITALS — BP 120/76 | HR 76 | Temp 98.0°F | Resp 16 | Ht 72.0 in | Wt 280.0 lb

## 2022-11-01 DIAGNOSIS — Z0001 Encounter for general adult medical examination with abnormal findings: Secondary | ICD-10-CM

## 2022-11-01 DIAGNOSIS — Z136 Encounter for screening for cardiovascular disorders: Secondary | ICD-10-CM

## 2022-11-01 DIAGNOSIS — Z13220 Encounter for screening for lipoid disorders: Secondary | ICD-10-CM | POA: Diagnosis not present

## 2022-11-01 DIAGNOSIS — F1010 Alcohol abuse, uncomplicated: Secondary | ICD-10-CM | POA: Diagnosis not present

## 2022-11-01 DIAGNOSIS — I10 Essential (primary) hypertension: Secondary | ICD-10-CM

## 2022-11-01 DIAGNOSIS — Z23 Encounter for immunization: Secondary | ICD-10-CM

## 2022-11-01 DIAGNOSIS — F10982 Alcohol use, unspecified with alcohol-induced sleep disorder: Secondary | ICD-10-CM

## 2022-11-01 DIAGNOSIS — Z6837 Body mass index (BMI) 37.0-37.9, adult: Secondary | ICD-10-CM

## 2022-11-01 LAB — COMPREHENSIVE METABOLIC PANEL
ALT: 19 U/L (ref 0–53)
AST: 19 U/L (ref 0–37)
Albumin: 4.8 g/dL (ref 3.5–5.2)
Alkaline Phosphatase: 65 U/L (ref 39–117)
BUN: 27 mg/dL — ABNORMAL HIGH (ref 6–23)
CO2: 27 mEq/L (ref 19–32)
Calcium: 9.7 mg/dL (ref 8.4–10.5)
Chloride: 101 mEq/L (ref 96–112)
Creatinine, Ser: 0.91 mg/dL (ref 0.40–1.50)
GFR: 114.28 mL/min (ref >60.00)
Glucose, Bld: 93 mg/dL (ref 70–99)
Potassium: 4 mEq/L (ref 3.5–5.1)
Sodium: 138 mEq/L (ref 135–145)
Total Bilirubin: 0.8 mg/dL (ref 0.2–1.2)
Total Protein: 7.3 g/dL (ref 6.0–8.3)

## 2022-11-01 LAB — LIPID PANEL
Cholesterol: 161 mg/dL (ref 0–200)
HDL: 42.7 mg/dL (ref >39.00)
LDL Cholesterol: 106 mg/dL — ABNORMAL HIGH (ref 0–99)
NonHDL: 118.44
Total CHOL/HDL Ratio: 4
Triglycerides: 61 mg/dL (ref 0.0–149.0)
VLDL: 12.2 mg/dL (ref 0.0–40.0)

## 2022-11-01 NOTE — Patient Instructions (Addendum)
Melatonin 5-10mg or magnesium glycinate 458m or unisom 1tab at bedtime as needed for sleep. Go to lab  Preventive Care [age] Old, Male Preventive care refers to lifestyle choices and visits with your health care provider that can promote health and wellness. Preventive care visits are also called wellness exams. What can I expect for my preventive care visit? Counseling During your preventive care visit, your health care provider may ask about your: Medical history, including: Past medical problems. Family medical history. Current health, including: Emotional well-being. Home life and relationship well-being. Sexual activity. Lifestyle, including: Alcohol, nicotine or tobacco, and drug use. Access to firearms. Diet, exercise, and sleep habits. Safety issues such as seatbelt and bike helmet use. Sunscreen use. Work and work eStatistician Physical exam Your health care provider may check your: Height and weight. These may be used to calculate your BMI (body mass index). BMI is a measurement that tells if you are at a healthy weight. Waist circumference. This measures the distance around your waistline. This measurement also tells if you are at a healthy weight and may help predict your risk of certain diseases, such as type 2 diabetes and high blood pressure. Heart rate and blood pressure. Body temperature. Skin for abnormal spots. What immunizations do I need?  Vaccines are usually given at various ages, according to a schedule. Your health care provider will recommend vaccines for you based on your age, medical history, and lifestyle or other factors, such as travel or where you work. What tests do I need? Screening Your health care provider may recommend screening tests for certain conditions. This may include: Lipid and cholesterol levels. Diabetes screening. This is done by checking your blood sugar (glucose) after you have not eaten for a while (fasting). Hepatitis B  test. Hepatitis C test. HIV (human immunodeficiency virus) test. STI (sexually transmitted infection) testing, if you are at risk. Talk with your health care provider about your test results, treatment options, and if necessary, the need for more tests. Follow these instructions at home: Eating and drinking  Eat a healthy diet that includes fresh fruits and vegetables, whole grains, lean protein, and low-fat dairy products. Drink enough fluid to keep your urine pale yellow. Take vitamin and mineral supplements as recommended by your health care provider. Do not drink alcohol if your health care provider tells you not to drink. If you drink alcohol: Limit how much you have to 0-2 drinks a day. Know how much alcohol is in your drink. In the [place] one drink equals one 12 oz bottle of beer (355 mL), one 5 oz glass of wine (148 mL), or one 1 oz glass of hard liquor (44 mL). Lifestyle Brush your teeth every morning and night with fluoride toothpaste. Floss one time each day. Exercise for at least 30 minutes 5 or more days each week. Do not use any products that contain nicotine or tobacco. These products include cigarettes, chewing tobacco, and vaping devices, such as e-cigarettes. If you need help quitting, ask your health care provider. Do not use drugs. If you are sexually active, practice safe sex. Use a condom or other form of protection to prevent STIs. Find healthy ways to manage stress, such as: Meditation, yoga, or listening to music. Journaling. Talking to a trusted person. Spending time with friends and family. Minimize exposure to UV radiation to reduce your risk of skin cancer. Safety Always wear your seat belt while driving or riding in a vehicle. Do not drive: If you have been  drinking alcohol. Do not ride with someone who has been drinking. If you have been using any mind-altering substances or drugs. While texting. When you are tired or distracted. Wear a helmet and  other protective equipment during sports activities. If you have firearms in your house, make sure you follow all gun safety procedures. Seek help if you have been physically or sexually abused. What's next? Go to your health care provider once a year for an annual wellness visit. Ask your health care provider how often you should have your eyes and teeth checked. Stay up to date on all vaccines. This information is not intended to replace advice given to you by your health care provider. Make sure you discuss any questions you have with your health care provider. Document Revised: 03/07/2021 Document Reviewed: 03/07/2021 Elsevier Patient Education  [place].

## 2022-11-01 NOTE — Progress Notes (Signed)
Complete physical exam  Patient: Christian Wagner   DOB: [birth year]   27 y.o. Male  MRN: [id number] Visit Date: 11/01/2022  Subjective:    Chief Complaint  Patient presents with   Annual Exam    Pt is fasting, tDap    Christian Wagner is a 27 y.o. male who presents today for a complete physical exam. He reports consuming a low fat diet.  Daily exercise at home (cardio and weight training)  He generally feels well. He reports sleeping well. He does not have additional problems to discuss today.  Vision:No Dental:No STD Screen:No  BP Readings from Last 3 Encounters:  11/01/22 120/76  04/30/22 138/88  10/30/21 134/78   Wt Readings from Last 3 Encounters:  11/01/22 280 lb (127 kg)  04/30/22 240 lb 9.6 oz (109.1 kg)  10/30/21 251 lb (113.9 kg)   Most recent fall risk assessment:    11/01/2022    9:52 AM  Fall Risk   Falls in the past year? 0  Number falls in past yr: 0  Injury with Fall? 0   Depression screen:Yes - No Depression  Most recent depression screenings:    11/01/2022    9:52 AM 04/26/2021   10:03 AM  PHQ 2/9 Scores  PHQ - 2 Score 0 2  PHQ- 9 Score  7   HPI  Alcohol use disorder, mild, abuse Denies any use of ETOH in last 1week. Denies any tremors or irritability or seizure activity Reports insomnia due to lack or ETOH consumption Drinks 1can of seltzer drink daily-with caffeine No tobacco use No illicit drug use  Advised to try Melatonin 5-10mg or magnesium glycinate 442m or unisom 1tab at bedtime as needed for sleep. Advised avoid caffeinated drinks.  Essential hypertension BP at goal with benicar hctz BP Readings from Last 3 Encounters:  11/01/22 120/76  04/30/22 138/88  10/30/21 134/78    Maintain med dose Repeat cmp  Past Medical History:  Diagnosis Date   Epilepsy ([place]    No past surgical history on file. Social History   Socioeconomic History   Marital status: Single    Spouse name: Not on file   Number of children: Not on  file   Years of education: Not on file   Highest education level: Not on file  Occupational History   Not on file  Tobacco Use   Smoking status: Former   Smokeless tobacco: Never  Vaping Use   Vaping Use: Never used  Substance and Sexual Activity   Alcohol use: Yes    Alcohol/week: 14.0 standard drinks of alcohol    Types: 14 Cans of beer per week   Drug use: No   Sexual activity: Not on file  Other Topics Concern   Not on file  Social History Narrative   Not on file   Social Determinants of Health   Financial Resource Strain: Not on file  Food Insecurity: Not on file  Transportation Needs: Not on file  Physical Activity: Not on file  Stress: Not on file  Social Connections: Not on file  Intimate Partner Violence: Not on file   Family Status  Relation Name Status   Mat Uncle  (Not Specified)   MGM  (Not Specified)   Family History  Problem Relation Age of Onset   Hypertension Maternal Uncle    Hypertension Maternal Grandmother    Allergies  Allergen Reactions   Penicillins     Patient Care Team: Naji Mehringer, CCharlene Brooke NP as PCP -  General (Internal Medicine)   Medications: Outpatient Medications Prior to Visit  Medication Sig   Multiple Vitamin (MULTIVITAMIN) tablet Take 1 tablet by mouth daily.   olmesartan-hydrochlorothiazide (BENICAR HCT) 40-25 MG tablet TAKE 1 TABLET BY MOUTH EVERY DAY   No facility-administered medications prior to visit.   Review of Systems  Constitutional:  Negative for fever.  HENT:  Negative for congestion and sore throat.   Eyes:        Negative for visual changes  Respiratory:  Negative for cough and shortness of breath.   Cardiovascular:  Negative for chest pain, palpitations and leg swelling.  Gastrointestinal:  Negative for blood in stool, constipation and diarrhea.  Genitourinary:  Negative for dysuria, frequency and urgency.  Musculoskeletal:  Negative for myalgias.  Skin:  Negative for rash.  Neurological:  Negative for  dizziness and headaches.  Hematological:  Does not bruise/bleed easily.  Psychiatric/Behavioral:  Negative for suicidal ideas. The patient is not nervous/anxious.         Objective:  BP 120/76 (BP Location: Left Arm, Patient Position: Sitting, Cuff Size: Large)   Pulse 76   Temp 98 F (36.7 C) (Temporal)   Resp 16   Ht 6' (1.829 m)   Wt 280 lb (127 kg)   SpO2 98%   BMI 37.97 kg/m     Physical Exam Vitals and nursing note reviewed.  Constitutional:      General: He is not in acute distress.    Appearance: He is obese.  HENT:     Right Ear: Tympanic membrane, ear canal and external ear normal.     Left Ear: Tympanic membrane, ear canal and external ear normal.     Nose: Mucosal edema present.     Right Sinus: Maxillary sinus tenderness and frontal sinus tenderness present.     Left Sinus: Maxillary sinus tenderness and frontal sinus tenderness present.     Mouth/Throat:     Pharynx: Uvula midline.  Eyes:     General: No scleral icterus.    Extraocular Movements: Extraocular movements intact.     Conjunctiva/sclera: Conjunctivae normal.     Pupils: Pupils are equal, round, and reactive to light.  Cardiovascular:     Rate and Rhythm: Normal rate and regular rhythm.     Pulses: Normal pulses.     Heart sounds: Normal heart sounds.  Pulmonary:     Effort: Pulmonary effort is normal.     Breath sounds: Normal breath sounds.  Abdominal:     General: Bowel sounds are normal.     Palpations: Abdomen is soft.  Musculoskeletal:     Cervical back: Normal range of motion and neck supple.     Right lower leg: No edema.     Left lower leg: No edema.  Lymphadenopathy:     Cervical: No cervical adenopathy.  Skin:    General: Skin is warm and dry.  Neurological:     Mental Status: He is alert and oriented to person, place, and time.  Psychiatric:        Mood and Affect: Mood normal.        Behavior: Behavior normal.        Thought Content: Thought content normal.       Results for orders placed or performed in visit on 11/01/22  Comprehensive metabolic panel  Result Value Ref Range   Sodium 138 135 - 145 mEq/L   Potassium 4.0 3.5 - 5.1 mEq/L   Chloride 101 96 - 112 mEq/L  CO2 27 19 - 32 mEq/L   Glucose, Bld 93 70 - 99 mg/dL   BUN 27 (H) 6 - 23 mg/dL   Creatinine, Ser 0.91 0.40 - 1.50 mg/dL   Total Bilirubin 0.8 0.2 - 1.2 mg/dL   Alkaline Phosphatase 65 39 - 117 U/L   AST 19 0 - 37 U/L   ALT 19 0 - 53 U/L   Total Protein 7.3 6.0 - 8.3 g/dL   Albumin 4.8 3.5 - 5.2 g/dL   GFR 114.28 >60.00 mL/min   Calcium 9.7 8.4 - 10.5 mg/dL  Lipid panel  Result Value Ref Range   Cholesterol 161 0 - 200 mg/dL   Triglycerides 61.0 0.0 - 149.0 mg/dL   HDL 42.70 >39.00 mg/dL   VLDL 12.2 0.0 - 40.0 mg/dL   LDL Cholesterol 106 (H) 0 - 99 mg/dL   Total CHOL/HDL Ratio 4    NonHDL 118.44       Assessment & Plan:    Routine Health Maintenance and Physical Exam  Immunization History  Administered Date(s) Administered   Influenza,inj,Quad PF,6+ Mos 10/13/2019   PFIZER Comirnaty(Gray Top)Covid-19 Tri-Sucrose Vaccine 09/15/2020   Td 11/01/2022   Tdap 03/20/2012   Health Maintenance  Topic Date Due   INFLUENZA VACCINE  12/22/2022 (Originally 04/23/2022)   Hepatitis C Screening  11/02/2023 (Originally 10/21/2011)   HIV Screening  11/02/2023 (Originally 10/20/2008)   DTaP/Tdap/Td (3 - Td or Tdap) 11/01/2032   HPV VACCINES  Aged Out   COVID-19 Vaccine  Discontinued   Discussed health benefits of physical activity, and encouraged him to engage in regular exercise appropriate for his age and condition.  Problem List Items Addressed This Visit       Cardiovascular and Mediastinum   Essential hypertension    BP at goal with benicar hctz BP Readings from Last 3 Encounters:  11/01/22 120/76  04/30/22 138/88  10/30/21 134/78    Maintain med dose Repeat cmp        Other   Alcohol use disorder, mild, abuse    Denies any use of ETOH in last 1week. Denies  any tremors or irritability or seizure activity Reports insomnia due to lack or ETOH consumption Drinks 1can of seltzer drink daily-with caffeine No tobacco use No illicit drug use  Advised to try Melatonin 5-10mg or magnesium glycinate 462m or unisom 1tab at bedtime as needed for sleep. Advised avoid caffeinated drinks.      Obesity   Relevant Orders   Lipid panel (Completed)   Other Visit Diagnoses     Encounter for preventative adult health care exam with abnormal findings    -  Primary   Relevant Orders   Comprehensive metabolic panel (Completed)   Lipid panel (Completed)   Need for tetanus booster       Relevant Orders   Td : Tetanus/diphtheria >7yo Preservative  free (Completed)   [place]       Encounter for lipid screening for cardiovascular disease          Return in about 6 months (around 05/02/2023) for HTN.     CWilfred Lacy NP

## 2022-11-01 NOTE — Assessment & Plan Note (Signed)
BP at goal with benicar hctz BP Readings from Last 3 Encounters:  11/01/22 120/76  04/30/22 138/88  10/30/21 134/78    Maintain med dose Repeat cmp

## 2022-11-01 NOTE — Patient Instructions (Signed)
Visit Information  Thank you for taking time to visit with me today. Please don't hesitate to contact me if I can be of assistance to you.   Following are the goals we discussed today:   Goals Addressed             This Visit's Progress    COMPLETED: Care coordination Activities-No follow up required       Care Coordination Interventions: Advised patient to Annual Wellness exam. Discussed [facility] services and support. Assessed SDOH. Advised to discuss with primary care physician if services needed in the future.         If you are experiencing a Mental Health or [place] or need someone to talk to, please call the Suicide and Crisis Lifeline: 988   Patient verbalizes understanding of instructions and care plan provided today and agrees to view in [place]. Active MyChart status and patient understanding of how to access instructions and care plan via MyChart confirmed with patient.     No further follow up required: Decline  Jone Baseman, RN, MSN [place] Care Management Coordinator Direct Line [phone]

## 2022-11-01 NOTE — Patient Outreach (Signed)
  Care Coordination   In Person Provider Office Visit Note   11/01/2022 Name: Christian Wagner MRN: [id number] DOB: [birth year]  Christian Wagner is a 27 y.o. year old male who sees Nche, Charlene Brooke, NP for primary care. I engaged with Christian Wagner in the providers office today.  What matters to the patients health and wellness today?  none    Goals Addressed             This Visit's Progress    COMPLETED: Care coordination Activities-No follow up required       Care Coordination Interventions: Advised patient to Annual Wellness exam. Discussed [facility] services and support. Assessed SDOH. Advised to discuss with primary care physician if services needed in the future.        SDOH assessments and interventions completed:  Yes     Care Coordination Interventions:  Yes, provided   Follow up plan: No further intervention required.   Encounter Outcome:  Pt. Visit Completed   Jone Baseman, RN, MSN [facility] Care Management Coordinator Direct Line [phone]

## 2022-11-01 NOTE — Assessment & Plan Note (Addendum)
Denies any use of ETOH in last 1week. Denies any tremors or irritability or seizure activity Reports insomnia due to lack or ETOH consumption Drinks 1can of seltzer drink daily-with caffeine No tobacco use No illicit drug use  Advised to try Melatonin 5-10mg or magnesium glycinate 449m or unisom 1tab at bedtime as needed for sleep. Advised avoid caffeinated drinks.

## 2023-04-23 ENCOUNTER — Other Ambulatory Visit: Payer: Self-pay | Admitting: Nurse Practitioner

## 2023-04-23 DIAGNOSIS — I10 Essential (primary) hypertension: Secondary | ICD-10-CM

## 2023-04-23 NOTE — Telephone Encounter (Signed)
Chart supports rx. Last OV: 11/01/2022 Next OV: 05/02/2023

## 2023-05-02 ENCOUNTER — Ambulatory Visit: Payer: BC Managed Care – PPO | Admitting: Nurse Practitioner

## 2023-05-02 ENCOUNTER — Encounter: Payer: Self-pay | Admitting: Nurse Practitioner

## 2023-05-02 VITALS — BP 124/76 | HR 82 | Temp 97.9°F | Resp 16 | Ht 72.0 in | Wt 270.8 lb

## 2023-05-02 DIAGNOSIS — I10 Essential (primary) hypertension: Secondary | ICD-10-CM | POA: Diagnosis not present

## 2023-05-02 DIAGNOSIS — F1010 Alcohol abuse, uncomplicated: Secondary | ICD-10-CM

## 2023-05-02 NOTE — Assessment & Plan Note (Signed)
Drinks 11 12oz cans of beer on the weekend only Advised to avoid or limit ALCOHOL intake to average of 6cans weekly. Advised about possible complications of ALCOHOL abuse.

## 2023-05-02 NOTE — Assessment & Plan Note (Signed)
BP at goal with benicar hctz BP Readings from Last 3 Encounters:  05/02/23 124/76  11/01/22 120/76  04/30/22 138/88    Maintain med dose

## 2023-05-02 NOTE — Patient Instructions (Signed)
Minimize ALCOHOL intake to 1 12oz can daily Maintain med dose  Alcohol Use Disorder Alcohol use disorder is a condition in which drinking disrupts daily life. People with this condition drink too much alcohol and cannot control their drinking. Alcohol use disorder can cause serious problems with physical health. It can affect the brain, heart, and other internal organs. This disorder can raise the risk for certain cancers and cause problems with mental health, such as depression or anxiety. What are the causes? This condition is caused by drinking too much alcohol over time. Some people with this condition drink to cope with or escape from negative life events. Others drink to relieve symptoms of physical pain or symptoms of mental illness. What increases the risk? You are more likely to develop this condition if: You have a family history of alcohol use disorder. Your culture encourages drinking to the point of becoming drunk (intoxication). You had a mood or conduct disorder in childhood. You have been abused. You are an adolescent and you: Have poor performance in school. Have poor supervision or guidance. Act on impulse and like taking risks. What are the signs or symptoms? Symptoms of this condition include: Drinking more than you want to. Trying several times without success to drink less. Spending a lot of time thinking about alcohol, getting alcohol, drinking alcohol, or recovering from drinking alcohol. Continuing to drink even when it is causing serious problems in your daily life. Drinking when it is dangerous to drink, such as before driving a car. Needing more and more alcohol to get the same effect you want (building up tolerance). Having symptoms of withdrawal when you stop drinking. Withdrawal symptoms may include: Trouble sleeping, leading to tiredness (fatigue). Mood swings of depression and anxiety. Physical symptoms, such as a fast heart rate, rapid breathing, high  blood pressure (hypertension), fever, cold sweats, or nausea. Seizures. Severe confusion. Feeling or seeing things that are not there (hallucinations). Shaking movements that you cannot control (tremors). How is this diagnosed? This condition is diagnosed with an assessment. Your health care provider may start by asking three or four questions about your drinking, or they may give you a simple test to take. This helps to get clear information from you. You may also have a physical exam or lab tests. You may be referred to a substance abuse counselor. How is this treated? With education, some people with alcohol use disorder are able to reduce their drinking. Many with this disorder cannot change their drinking behavior on their own and need help with treatment from substance use specialists. Treatments may include: Detoxification. Detoxification involves quitting drinking with supervision and direction of health care providers. Your health care provider may prescribe medicines within the first week to help lessen withdrawal symptoms. Alcohol withdrawal can be dangerous and life-threatening. Detoxification may be provided in a home, community, or primary care setting, or in a hospital or substance use treatment facility. Counseling. This may involve motivational interviewing (MI), family therapy, or cognitive behavioral therapy (CBT). A counselor can address the things you can do to change your drinking behavior and how to maintain the changes. Talk therapy aims to: Identify your positive motivations to change. Identify and avoid the things that trigger your drinking. Help you learn how to plan your behavior change. Develop support systems that can help you sustain the change. Medicines. Medicines can help treat this disorder by: Decreasing cravings. Decreasing the positive feeling you have when you drink. Causing an uncomfortable physical reaction when you drink (  aversion therapy). Support groups  such as Alcoholics Anonymous (AA). These groups are led by people who have quit drinking. The groups provide emotional support, advice, and guidance. Some people with this condition benefit from a combination of treatments provided by specialized substance use treatment centers. Follow these instructions at home:  Medicines Take over-the-counter and prescription medicines only as told by your health care provider. Ask before starting any new medicines, herbs, or supplements. General instructions Ask friends and family members to support your choice to stay sober. Avoid places where alcohol is served. Create a plan to deal with tempting situations. Attend support groups regularly. Practice hobbies or activities you enjoy. Do not drink and drive. How is this prevented? Do not drink alcohol if your health care provider tells you not to drink. If you drink alcohol: Limit how much you have to: 0-1 drink a day for women who are not pregnant. 0-2 drinks a day for men. Know how much alcohol is in your drink. In the [place] one drink equals one 12 oz bottle of beer (355 mL), one 5 oz glass of wine (148 mL), or one 1 oz glass of hard liquor (44 mL). If you have a mental health condition, seek treatment. Develop a healthy lifestyle through: Meditation or deep breathing. Exercise. Spending time in nature. Listening to music. Talking with a trusted friend or family member. If you are a teen: Do not drink alcohol. Avoid gatherings where you might be tempted to drink alcohol. Do not be afraid to say no if someone offers you alcohol. Speak up about why you do not want to drink. Set a positive example for others around you by not drinking. Build relationships with friends who do not drink. Where to find more information Substance Abuse and Mental Health Services Administration: RockToxic.pl Alcoholics Anonymous: CustomizedRugs.fi Contact a health care provider if: You cannot take your medicines as told. Your  symptoms get worse or you experience symptoms of withdrawal when you stop drinking. You start drinking again (relapse) and your symptoms get worse. Get help right away if: You have thoughts about hurting yourself or others. Get help right away if you feel like you may hurt yourself or others, or have thoughts about taking your own life. Go to your nearest emergency room or: Call 911. Call the National Suicide Prevention Lifeline at (919) 389-9097 or 988. This is open 24 hours a day. Text the Crisis Text Line at 848-419-6203. Summary Alcohol use disorder is a condition in which drinking disrupts daily life. People with this condition drink too much alcohol and cannot control their drinking. Treatment may include detoxification, counseling, medicines, and support groups. Ask friends and family members to support you. Avoid situations where alcohol is served. Get help right away if you have thoughts about hurting yourself or others. This information is not intended to replace advice given to you by your health care provider. Make sure you discuss any questions you have with your health care provider. Document Revised: 11/14/2021 Document Reviewed: 11/14/2021 Elsevier Patient Education  2024 [employer].

## 2023-05-02 NOTE — Progress Notes (Signed)
                Established Patient Visit  Patient: Christian Wagner   DOB: 11-Dec-1995   27 y.o. Male  MRN: [id number] Visit Date: 05/02/2023  Subjective:    Chief Complaint  Patient presents with   Hypertension   Alcohol use disorder, mild, abuse Drinks 11 12oz cans of beer on the weekend only Advised to avoid or limit ALCOHOL intake to average of 6cans weekly. Advised about possible complications of ALCOHOL abuse.  Essential hypertension BP at goal with benicar hctz BP Readings from Last 3 Encounters:  05/02/23 124/76  11/01/22 120/76  04/30/22 138/88    Maintain med dose  Wt Readings from Last 3 Encounters:  05/02/23 270 lb 12.8 oz (122.8 kg)  11/01/22 280 lb (127 kg)  04/30/22 240 lb 9.6 oz (109.1 kg)    Reviewed medical, surgical, and social history today  Medications: Outpatient Medications Prior to Visit  Medication Sig   Multiple Vitamin (MULTIVITAMIN) tablet Take 1 tablet by mouth daily.   olmesartan-hydrochlorothiazide (BENICAR HCT) 40-25 MG tablet TAKE 1 TABLET BY MOUTH EVERY DAY   No facility-administered medications prior to visit.   Reviewed past medical and social history.   ROS per HPI above      Objective:  BP 124/76 (BP Location: Left Arm, Patient Position: Sitting, Cuff Size: Large)   Pulse 82   Temp 97.9 F (36.6 C) (Temporal)   Resp 16   Ht 6' (1.829 m)   Wt 270 lb 12.8 oz (122.8 kg)   SpO2 99%   BMI 36.73 kg/m      Physical Exam Cardiovascular:     Rate and Rhythm: Normal rate and regular rhythm.     Pulses: Normal pulses.     Heart sounds: Normal heart sounds.  Pulmonary:     Effort: Pulmonary effort is normal.     Breath sounds: Normal breath sounds.  Neurological:     Mental Status: He is alert and oriented to person, place, and time.     No results found for any visits on 05/02/23.    Assessment & Plan:    Problem List Items Addressed This Visit       Cardiovascular and Mediastinum   Essential hypertension    BP  at goal with benicar hctz BP Readings from Last 3 Encounters:  05/02/23 124/76  11/01/22 120/76  04/30/22 138/88    Maintain med dose         Other   Alcohol use disorder, mild, abuse - Primary    Drinks 11 12oz cans of beer on the weekend only Advised to avoid or limit ALCOHOL intake to average of 6cans weekly. Advised about possible complications of ALCOHOL abuse.      Return in about 6 months (around 11/02/2023) for CPE (fasting).     [name], NP

## 2023-10-19 ENCOUNTER — Other Ambulatory Visit: Payer: Self-pay | Admitting: Nurse Practitioner

## 2023-10-19 DIAGNOSIS — I10 Essential (primary) hypertension: Secondary | ICD-10-CM

## 2023-11-07 ENCOUNTER — Ambulatory Visit: Payer: BC Managed Care – PPO | Admitting: Nurse Practitioner

## 2023-11-07 ENCOUNTER — Encounter: Payer: Self-pay | Admitting: Nurse Practitioner

## 2023-11-07 VITALS — BP 126/68 | HR 86 | Temp 98.0°F | Ht 72.0 in | Wt 268.8 lb

## 2023-11-07 DIAGNOSIS — T502X5A Adverse effect of carbonic-anhydrase inhibitors, benzothiadiazides and other diuretics, initial encounter: Secondary | ICD-10-CM

## 2023-11-07 DIAGNOSIS — I10 Essential (primary) hypertension: Secondary | ICD-10-CM | POA: Diagnosis not present

## 2023-11-07 DIAGNOSIS — E66812 Obesity, class 2: Secondary | ICD-10-CM

## 2023-11-07 DIAGNOSIS — F1010 Alcohol abuse, uncomplicated: Secondary | ICD-10-CM | POA: Diagnosis not present

## 2023-11-07 DIAGNOSIS — E7800 Pure hypercholesterolemia, unspecified: Secondary | ICD-10-CM | POA: Diagnosis not present

## 2023-11-07 DIAGNOSIS — Z0001 Encounter for general adult medical examination with abnormal findings: Secondary | ICD-10-CM

## 2023-11-07 DIAGNOSIS — Z6836 Body mass index (BMI) 36.0-36.9, adult: Secondary | ICD-10-CM

## 2023-11-07 DIAGNOSIS — E876 Hypokalemia: Secondary | ICD-10-CM

## 2023-11-07 LAB — CBC
HCT: 43.2 % (ref 39.0–52.0)
Hemoglobin: 14.3 g/dL (ref 13.0–17.0)
MCHC: 33.1 g/dL (ref 30.0–36.0)
MCV: 88.4 fL (ref 78.0–100.0)
Platelets: 209 10*3/uL (ref 150.0–400.0)
RBC: 4.89 Mil/uL (ref 4.22–5.81)
RDW: 13.5 % (ref 11.5–15.5)
WBC: 6 10*3/uL (ref 4.0–10.5)

## 2023-11-07 LAB — LIPID PANEL
Cholesterol: 156 mg/dL (ref 0–200)
HDL: 34 mg/dL — ABNORMAL LOW (ref >39.00)
LDL Cholesterol: 113 mg/dL — ABNORMAL HIGH (ref 0–99)
NonHDL: 121.82
Total CHOL/HDL Ratio: 5
Triglycerides: 46 mg/dL (ref 0.0–149.0)
VLDL: 9.2 mg/dL (ref 0.0–40.0)

## 2023-11-07 LAB — COMPREHENSIVE METABOLIC PANEL
ALT: 21 U/L (ref 0–53)
AST: 24 U/L (ref 0–37)
Albumin: 4.5 g/dL (ref 3.5–5.2)
Alkaline Phosphatase: 70 U/L (ref 39–117)
BUN: 20 mg/dL (ref 6–23)
CO2: 27 meq/L (ref 19–32)
Calcium: 9.1 mg/dL (ref 8.4–10.5)
Chloride: 101 meq/L (ref 96–112)
Creatinine, Ser: 0.87 mg/dL (ref 0.40–1.50)
GFR: 116.16 mL/min (ref >60.00)
Glucose, Bld: 94 mg/dL (ref 70–99)
Potassium: 2.9 meq/L — ABNORMAL LOW (ref 3.5–5.1)
Sodium: 137 meq/L (ref 135–145)
Total Bilirubin: 0.7 mg/dL (ref 0.2–1.2)
Total Protein: 7.4 g/dL (ref 6.0–8.3)

## 2023-11-07 LAB — TSH: TSH: 1.49 u[IU]/mL (ref 0.35–5.50)

## 2023-11-07 MED ORDER — OLMESARTAN MEDOXOMIL-HCTZ 40-25 MG PO TABS: 1.0000 | ORAL_TABLET | Freq: Every day | ORAL | 3 refills | Status: DC

## 2023-11-07 NOTE — Assessment & Plan Note (Signed)
BP at goal with benicar hctz BP Readings from Last 3 Encounters:  11/07/23 126/68  05/02/23 124/76  11/01/22 120/76    Maintain med dose

## 2023-11-07 NOTE — Progress Notes (Signed)
Complete physical exam  Patient: Christian Wagner   DOB: 01-Mar-1996   28 y.o. Male  MRN: [id number] Visit Date: 11/07/2023  Subjective:    Chief Complaint  Patient presents with   Annual Exam    With fasting lab work, no concerns   Christian Wagner is a 28 y.o. male who presents today for a complete physical exam. He reports consuming a low fat and low sodium diet. Home exercise routine includes calisthenics and treadmill. He generally feels well. He reports sleeping fairly well. He does have additional problems to discuss today.  Vision:No Dental:No STD Screen:No  BP Readings from Last 3 Encounters:  11/07/23 126/68  05/02/23 124/76  11/01/22 120/76   Wt Readings from Last 3 Encounters:  11/07/23 268 lb 12.8 oz (121.9 kg)  05/02/23 270 lb 12.8 oz (122.8 kg)  11/01/22 280 lb (127 kg)   Most recent fall risk assessment:    11/07/2023    8:29 AM  Fall Risk   Falls in the past year? 0  Number falls in past yr: 0  Injury with Fall? 0  Risk for fall due to : No Fall Risks  Follow up Falls evaluation completed     Depression screen:Yes - No Depression Most recent depression screenings:    11/07/2023    8:31 AM 05/02/2023    8:04 AM  PHQ 2/9 Scores  PHQ - 2 Score 3 0  PHQ- 9 Score 10     HPI  Alcohol use disorder, mild, abuse 12cans per week on weekend only. Denies any signs of withdrawal without use of ALCOHOL.  Encourage to minimize ALCOHOL consumption due to risk of cancer and liver disease.  Obesity Has maintain high protein diet, and daily exercise  (cardio and weight training) to facilitate weight loss. Wt Readings from Last 3 Encounters:  11/07/23 268 lb 12.8 oz (121.9 kg)  05/02/23 270 lb 12.8 oz (122.8 kg)  11/01/22 280 lb (127 kg)    Essential hypertension BP at goal with benicar hctz BP Readings from Last 3 Encounters:  11/07/23 126/68  05/02/23 124/76  11/01/22 120/76    Maintain med dose   Past Medical History:  Diagnosis Date    Epilepsy (HCC)    History reviewed. No pertinent surgical history. Social History   Socioeconomic History   Marital status: Single    Spouse name: Not on file   Number of children: Not on file   Years of education: Not on file   Highest education level: Not on file  Occupational History   Not on file  Tobacco Use   Smoking status: Former   Smokeless tobacco: Never  Vaping Use   Vaping status: Never Used  Substance and Sexual Activity   Alcohol use: Yes    Alcohol/week: 12.0 standard drinks of alcohol    Types: 12 Cans of beer per week   Drug use: No   Sexual activity: Not Currently  Other Topics Concern   Not on file  Social History Narrative   Not on file   Social Drivers of Health   Financial Resource Strain: Not on file  Food Insecurity: Not on file  Transportation Needs: Not on file  Physical Activity: Not on file  Stress: Not on file  Social Connections: Not on file  Intimate Partner Violence: Not on file   Family Status  Relation Name Status   Mat Uncle  (Not Specified)   MGM  (Not Specified)  No partnership data on file  Family History  Problem Relation Age of Onset   Hypertension Maternal Uncle    Hypertension Maternal Grandmother    Allergies  Allergen Reactions   Penicillins     Patient Care Team: Kamal Jurgens, Bonna Gains, NP as PCP - General (Internal Medicine)   Medications: Outpatient Medications Prior to Visit  Medication Sig   Multiple Vitamin (MULTIVITAMIN) tablet Take 1 tablet by mouth daily.   [DISCONTINUED] olmesartan-hydrochlorothiazide (BENICAR HCT) 40-25 MG tablet TAKE 1 TABLET BY MOUTH EVERY DAY   No facility-administered medications prior to visit.    Review of Systems  Constitutional:  Negative for activity change, appetite change and unexpected weight change.  Respiratory: Negative.    Cardiovascular: Negative.   Gastrointestinal: Negative.   Endocrine: Negative for cold intolerance and heat intolerance.  Genitourinary:  Negative.   Musculoskeletal: Negative.   Skin: Negative.   Neurological: Negative.   Hematological: Negative.   Psychiatric/Behavioral:  Negative for behavioral problems, decreased concentration, dysphoric mood, hallucinations, self-injury, sleep disturbance and suicidal ideas. The patient is not nervous/anxious.         Objective:  BP 126/68 (BP Location: Right Arm, Patient Position: Sitting, Cuff Size: Normal)   Pulse 86   Temp 98 F (36.7 C)   Ht 6' (1.829 m)   Wt 268 lb 12.8 oz (121.9 kg)   SpO2 100%   BMI 36.46 kg/m     Physical Exam Vitals and nursing note reviewed.  Constitutional:      General: He is not in acute distress. HENT:     Right Ear: Tympanic membrane, ear canal and external ear normal.     Left Ear: Tympanic membrane, ear canal and external ear normal.     Nose: Nose normal.  Eyes:     Extraocular Movements: Extraocular movements intact.     Conjunctiva/sclera: Conjunctivae normal.     Pupils: Pupils are equal, round, and reactive to light.  Neck:     Thyroid: No thyroid mass, thyromegaly or thyroid tenderness.  Cardiovascular:     Rate and Rhythm: Normal rate and regular rhythm.     Pulses: Normal pulses.     Heart sounds: Normal heart sounds.  Pulmonary:     Effort: Pulmonary effort is normal.     Breath sounds: Normal breath sounds.  Abdominal:     General: Bowel sounds are normal.     Palpations: Abdomen is soft.  Musculoskeletal:        General: Normal range of motion.     Cervical back: Normal range of motion and neck supple.     Right lower leg: No edema.     Left lower leg: No edema.  Lymphadenopathy:     Cervical: No cervical adenopathy.  Skin:    General: Skin is warm and dry.  Neurological:     Mental Status: He is alert and oriented to person, place, and time.     Cranial Nerves: No cranial nerve deficit.  Psychiatric:        Mood and Affect: Mood normal.        Behavior: Behavior normal.        Thought Content: Thought  content normal.      No results found for any visits on 11/07/23.    Assessment & Plan:    Routine Health Maintenance and Physical Exam  Immunization History  Administered Date(s) Administered   Influenza,inj,Quad PF,6+ Mos 10/13/2019   PFIZER Comirnaty(Gray Top)Covid-19 Tri-Sucrose Vaccine 09/15/2020   Td 11/01/2022   Tdap 03/20/2012  Health Maintenance  Topic Date Due   HIV Screening  Never done   Hepatitis C Screening  Never done   INFLUENZA VACCINE  12/22/2023 (Originally 04/24/2023)   DTaP/Tdap/Td (3 - Td or Tdap) 11/01/2032   HPV VACCINES  Aged Out   COVID-19 Vaccine  Discontinued    Discussed health benefits of physical activity, and encouraged him to engage in regular exercise appropriate for his age and condition.  Problem List Items Addressed This Visit     Alcohol use disorder, mild, abuse   12cans per week on weekend only. Denies any signs of withdrawal without use of ALCOHOL.  Encourage to minimize ALCOHOL consumption due to risk of cancer and liver disease.      Essential hypertension   BP at goal with benicar hctz BP Readings from Last 3 Encounters:  11/07/23 126/68  05/02/23 124/76  11/01/22 120/76    Maintain med dose       Relevant Medications   olmesartan-hydrochlorothiazide (BENICAR HCT) 40-25 MG tablet (Start on 04/12/2024)   Obesity   Has maintain high protein diet, and daily exercise  (cardio and weight training) to facilitate weight loss. Wt Readings from Last 3 Encounters:  11/07/23 268 lb 12.8 oz (121.9 kg)  05/02/23 270 lb 12.8 oz (122.8 kg)  11/01/22 280 lb (127 kg)        Other Visit Diagnoses       Encounter for preventative adult health care exam with abnormal findings    -  Primary   Relevant Orders   CBC   Comprehensive metabolic panel   TSH     Elevated LDL cholesterol level       Relevant Orders   Lipid panel      Return in about 1 year (around 11/06/2024) for CPE (fasting).     [name], NP

## 2023-11-07 NOTE — Patient Instructions (Signed)
Go to lab Maintain Heart healthy diet and daily exercise. Maintain current medications. Work on minimizing ALCOHOL consumption Schedule appointment for dental cleaning every 6months  Preventive Care 7-30 Years Old, Male Preventive care refers to lifestyle choices and visits with your health care provider that can promote health and wellness. Preventive care visits are also called wellness exams. What can I expect for my preventive care visit? Counseling During your preventive care visit, your health care provider may ask about your: Medical history, including: Past medical problems. Family medical history. Current health, including: Emotional well-being. Home life and relationship well-being. Sexual activity. Lifestyle, including: Alcohol, nicotine or tobacco, and drug use. Access to firearms. Diet, exercise, and sleep habits. Safety issues such as seatbelt and bike helmet use. Sunscreen use. Work and work Astronomer. Physical exam Your health care provider may check your: Height and weight. These may be used to calculate your BMI (body mass index). BMI is a measurement that tells if you are at a healthy weight. Waist circumference. This measures the distance around your waistline. This measurement also tells if you are at a healthy weight and may help predict your risk of certain diseases, such as type 2 diabetes and high blood pressure. Heart rate and blood pressure. Body temperature. Skin for abnormal spots. What immunizations do I need?  Vaccines are usually given at various ages, according to a schedule. Your health care provider will recommend vaccines for you based on your age, medical history, and lifestyle or other factors, such as travel or where you work. What tests do I need? Screening Your health care provider may recommend screening tests for certain conditions. This may include: Lipid and cholesterol levels. Diabetes screening. This is done by checking your  blood sugar (glucose) after you have not eaten for a while (fasting). Hepatitis B test. Hepatitis C test. HIV (human immunodeficiency virus) test. STI (sexually transmitted infection) testing, if you are at risk. Talk with your health care provider about your test results, treatment options, and if necessary, the need for more tests. Follow these instructions at home: Eating and drinking  Eat a healthy diet that includes fresh fruits and vegetables, whole grains, lean protein, and low-fat dairy products. Drink enough fluid to keep your urine pale yellow. Take vitamin and mineral supplements as recommended by your health care provider. Do not drink alcohol if your health care provider tells you not to drink. If you drink alcohol: Limit how much you have to 0-2 drinks a day. Know how much alcohol is in your drink. In the [place] one drink equals one 12 oz bottle of beer (355 mL), one 5 oz glass of wine (148 mL), or one 1 oz glass of hard liquor (44 mL). Lifestyle Brush your teeth every morning and night with fluoride toothpaste. Floss one time each day. Exercise for at least 30 minutes 5 or more days each week. Do not use any products that contain nicotine or tobacco. These products include cigarettes, chewing tobacco, and vaping devices, such as e-cigarettes. If you need help quitting, ask your health care provider. Do not use drugs. If you are sexually active, practice safe sex. Use a condom or other form of protection to prevent STIs. Find healthy ways to manage stress, such as: Meditation, yoga, or listening to music. Journaling. Talking to a trusted person. Spending time with friends and family. Minimize exposure to UV radiation to reduce your risk of skin cancer. Safety Always wear your seat belt while driving or riding in a vehicle.  Do not drive: If you have been drinking alcohol. Do not ride with someone who has been drinking. If you have been using any mind-altering substances  or drugs. While texting. When you are tired or distracted. Wear a helmet and other protective equipment during sports activities. If you have firearms in your house, make sure you follow all gun safety procedures. Seek help if you have been physically or sexually abused. What's next? Go to your health care provider once a year for an annual wellness visit. Ask your health care provider how often you should have your eyes and teeth checked. Stay up to date on all vaccines. This information is not intended to replace advice given to you by your health care provider. Make sure you discuss any questions you have with your health care provider. Document Revised: 03/07/2021 Document Reviewed: 03/07/2021 Elsevier Patient Education  2024 [employer].

## 2023-11-07 NOTE — Assessment & Plan Note (Addendum)
12cans per week on weekend only. Denies any signs of withdrawal without use of ALCOHOL.  Encourage to minimize ALCOHOL consumption due to risk of cancer and liver disease.

## 2023-11-07 NOTE — Assessment & Plan Note (Signed)
Has maintain high protein diet, and daily exercise  (cardio and weight training) to facilitate weight loss. Wt Readings from Last 3 Encounters:  11/07/23 268 lb 12.8 oz (121.9 kg)  05/02/23 270 lb 12.8 oz (122.8 kg)  11/01/22 280 lb (127 kg)

## 2023-11-12 ENCOUNTER — Encounter: Payer: Self-pay | Admitting: Nurse Practitioner

## 2023-11-12 MED ORDER — OLMESARTAN MEDOXOMIL-HCTZ 40-12.5 MG PO TABS: 1.0000 | ORAL_TABLET | Freq: Every day | ORAL | 1 refills | Status: DC

## 2023-11-12 MED ORDER — POTASSIUM CHLORIDE CRYS ER 20 MEQ PO TBCR: 40.0000 meq | EXTENDED_RELEASE_TABLET | Freq: Every day | ORAL | 0 refills | Status: AC

## 2023-11-12 NOTE — Addendum Note (Signed)
 Addended by: Michaela Corner on: 11/12/2023 08:59 AM   Modules accepted: Orders

## 2023-11-12 NOTE — Progress Notes (Signed)
 Normal cbc, tsh, renal and liver function low potassium due to use of hydrochlorothiazide. Sent potassium supplement. I change Benicar HCT dose from 40/25mg  to 40/12.5mg  Low HDL and elevated ldl: It is important to maintain a mediterranean diet, daily exercise, avoid tobacco and ALCOHOL use; to prevent development of heart disease and fatty liver.

## 2024-05-11 ENCOUNTER — Other Ambulatory Visit: Payer: Self-pay | Admitting: Nurse Practitioner

## 2024-05-11 DIAGNOSIS — I10 Essential (primary) hypertension: Secondary | ICD-10-CM

## 2024-11-09 ENCOUNTER — Encounter: Payer: BC Managed Care – PPO | Admitting: Nurse Practitioner
